# Patient Record
Sex: Female | Born: 1935
Health system: Southern US, Community
[De-identification: ages and names within clinical notes are randomized; demographics above are authoritative.]

## PROBLEM LIST (undated history)

## (undated) DIAGNOSIS — M109 Gout, unspecified: Secondary | ICD-10-CM

## (undated) DIAGNOSIS — I639 Cerebral infarction, unspecified: Secondary | ICD-10-CM

## (undated) DIAGNOSIS — I4891 Unspecified atrial fibrillation: Secondary | ICD-10-CM

## (undated) DIAGNOSIS — E669 Obesity, unspecified: Secondary | ICD-10-CM

## (undated) DIAGNOSIS — G5601 Carpal tunnel syndrome, right upper limb: Secondary | ICD-10-CM

## (undated) DIAGNOSIS — E785 Hyperlipidemia, unspecified: Secondary | ICD-10-CM

## (undated) DIAGNOSIS — I6529 Occlusion and stenosis of unspecified carotid artery: Secondary | ICD-10-CM

## (undated) DIAGNOSIS — G250 Essential tremor: Secondary | ICD-10-CM

## (undated) DIAGNOSIS — M1990 Unspecified osteoarthritis, unspecified site: Secondary | ICD-10-CM

## (undated) DIAGNOSIS — E119 Type 2 diabetes mellitus without complications: Secondary | ICD-10-CM

## (undated) DIAGNOSIS — I10 Essential (primary) hypertension: Secondary | ICD-10-CM

## (undated) DIAGNOSIS — N189 Chronic kidney disease, unspecified: Secondary | ICD-10-CM

## (undated) DIAGNOSIS — H409 Unspecified glaucoma: Secondary | ICD-10-CM

## (undated) HISTORY — DX: Unspecified glaucoma: H40.9

## (undated) HISTORY — DX: Unspecified atrial fibrillation: I48.91

## (undated) HISTORY — DX: Cerebral infarction, unspecified: I63.9

## (undated) HISTORY — DX: Gout, unspecified: M10.9

## (undated) HISTORY — DX: Essential tremor: G25.0

## (undated) HISTORY — DX: Obesity, unspecified: E66.9

## (undated) HISTORY — DX: Type 2 diabetes mellitus without complications: E11.9

## (undated) HISTORY — DX: Hyperlipidemia, unspecified: E78.5

## (undated) HISTORY — DX: Chronic kidney disease, unspecified: N18.9

## (undated) HISTORY — DX: Essential (primary) hypertension: I10

## (undated) HISTORY — DX: Unspecified osteoarthritis, unspecified site: M19.90

## (undated) HISTORY — DX: Carpal tunnel syndrome, right upper limb: G56.01

## (undated) HISTORY — DX: Occlusion and stenosis of unspecified carotid artery: I65.29

---

## 1980-03-08 HISTORY — PX: ABDOMINAL HYSTERECTOMY: SHX81

## 1984-03-08 HISTORY — PX: CHOLECYSTECTOMY: SHX55

## 2002-03-08 HISTORY — PX: CATARACT EXTRACTION EXTRACAPSULAR: SHX1305

## 2004-03-08 HISTORY — PX: CATARACT EXTRACTION EXTRACAPSULAR: SHX1305

## 2004-03-25 ENCOUNTER — Ambulatory Visit: Payer: Self-pay | Admitting: Ophthalmology

## 2004-03-31 ENCOUNTER — Ambulatory Visit: Payer: Self-pay | Admitting: Ophthalmology

## 2004-07-01 ENCOUNTER — Ambulatory Visit: Payer: Self-pay | Admitting: Family Medicine

## 2005-06-23 ENCOUNTER — Ambulatory Visit: Payer: Self-pay | Admitting: Ophthalmology

## 2005-06-29 ENCOUNTER — Ambulatory Visit: Payer: Self-pay | Admitting: Ophthalmology

## 2005-11-02 ENCOUNTER — Ambulatory Visit: Payer: Self-pay | Admitting: Family Medicine

## 2006-06-29 ENCOUNTER — Other Ambulatory Visit: Payer: Self-pay

## 2006-06-29 ENCOUNTER — Inpatient Hospital Stay: Payer: Self-pay | Admitting: Internal Medicine

## 2006-11-24 ENCOUNTER — Ambulatory Visit: Payer: Self-pay | Admitting: Family Medicine

## 2008-02-05 ENCOUNTER — Ambulatory Visit: Payer: Self-pay | Admitting: Family Medicine

## 2009-02-10 ENCOUNTER — Ambulatory Visit: Payer: Self-pay | Admitting: Family Medicine

## 2009-04-28 ENCOUNTER — Ambulatory Visit: Payer: Self-pay | Admitting: Family Medicine

## 2010-03-27 ENCOUNTER — Ambulatory Visit: Payer: Self-pay | Admitting: Family Medicine

## 2011-04-19 ENCOUNTER — Ambulatory Visit: Payer: Self-pay | Admitting: Family Medicine

## 2012-05-01 ENCOUNTER — Emergency Department: Payer: Self-pay | Admitting: Emergency Medicine

## 2012-05-01 LAB — CBC
MCHC: 33 g/dL (ref 32.0–36.0)
MCV: 97 fL (ref 80–100)
Platelet: 230 10*3/uL (ref 150–440)
WBC: 7.6 10*3/uL (ref 3.6–11.0)

## 2012-05-01 LAB — PROTIME-INR: Prothrombin Time: 22.9 secs — ABNORMAL HIGH (ref 11.5–14.7)

## 2013-03-20 DIAGNOSIS — I4891 Unspecified atrial fibrillation: Secondary | ICD-10-CM | POA: Diagnosis not present

## 2013-04-17 DIAGNOSIS — I4891 Unspecified atrial fibrillation: Secondary | ICD-10-CM | POA: Diagnosis not present

## 2013-05-15 DIAGNOSIS — I4891 Unspecified atrial fibrillation: Secondary | ICD-10-CM | POA: Diagnosis not present

## 2013-06-12 DIAGNOSIS — I4891 Unspecified atrial fibrillation: Secondary | ICD-10-CM | POA: Diagnosis not present

## 2013-06-19 DIAGNOSIS — E119 Type 2 diabetes mellitus without complications: Secondary | ICD-10-CM | POA: Diagnosis not present

## 2013-06-19 DIAGNOSIS — E7800 Pure hypercholesterolemia, unspecified: Secondary | ICD-10-CM | POA: Diagnosis not present

## 2013-06-26 DIAGNOSIS — I10 Essential (primary) hypertension: Secondary | ICD-10-CM | POA: Diagnosis not present

## 2013-06-26 DIAGNOSIS — Z0000 Encounter for general adult medical examination without abnormal findings: Secondary | ICD-10-CM | POA: Diagnosis not present

## 2013-06-26 DIAGNOSIS — I4891 Unspecified atrial fibrillation: Secondary | ICD-10-CM | POA: Diagnosis not present

## 2013-06-26 DIAGNOSIS — E119 Type 2 diabetes mellitus without complications: Secondary | ICD-10-CM | POA: Diagnosis not present

## 2013-06-27 DIAGNOSIS — I4891 Unspecified atrial fibrillation: Secondary | ICD-10-CM | POA: Diagnosis not present

## 2013-06-27 DIAGNOSIS — R609 Edema, unspecified: Secondary | ICD-10-CM | POA: Diagnosis not present

## 2013-06-27 DIAGNOSIS — I10 Essential (primary) hypertension: Secondary | ICD-10-CM | POA: Diagnosis not present

## 2013-07-10 DIAGNOSIS — I4891 Unspecified atrial fibrillation: Secondary | ICD-10-CM | POA: Diagnosis not present

## 2013-08-07 DIAGNOSIS — I4891 Unspecified atrial fibrillation: Secondary | ICD-10-CM | POA: Diagnosis not present

## 2013-09-06 DIAGNOSIS — I4891 Unspecified atrial fibrillation: Secondary | ICD-10-CM | POA: Diagnosis not present

## 2013-10-04 DIAGNOSIS — I4891 Unspecified atrial fibrillation: Secondary | ICD-10-CM | POA: Diagnosis not present

## 2013-10-10 DIAGNOSIS — M109 Gout, unspecified: Secondary | ICD-10-CM | POA: Diagnosis not present

## 2013-10-10 DIAGNOSIS — L0390 Cellulitis, unspecified: Secondary | ICD-10-CM | POA: Diagnosis not present

## 2013-10-10 DIAGNOSIS — L0291 Cutaneous abscess, unspecified: Secondary | ICD-10-CM | POA: Diagnosis not present

## 2013-10-16 DIAGNOSIS — I4891 Unspecified atrial fibrillation: Secondary | ICD-10-CM | POA: Diagnosis not present

## 2013-10-16 DIAGNOSIS — E119 Type 2 diabetes mellitus without complications: Secondary | ICD-10-CM | POA: Diagnosis not present

## 2013-10-23 DIAGNOSIS — I4891 Unspecified atrial fibrillation: Secondary | ICD-10-CM | POA: Diagnosis not present

## 2013-10-23 DIAGNOSIS — E785 Hyperlipidemia, unspecified: Secondary | ICD-10-CM | POA: Diagnosis not present

## 2013-10-23 DIAGNOSIS — M109 Gout, unspecified: Secondary | ICD-10-CM | POA: Diagnosis not present

## 2013-10-23 DIAGNOSIS — I10 Essential (primary) hypertension: Secondary | ICD-10-CM | POA: Diagnosis not present

## 2013-11-13 DIAGNOSIS — I4891 Unspecified atrial fibrillation: Secondary | ICD-10-CM | POA: Diagnosis not present

## 2013-11-19 DIAGNOSIS — I4891 Unspecified atrial fibrillation: Secondary | ICD-10-CM | POA: Diagnosis not present

## 2013-11-19 DIAGNOSIS — Z79899 Other long term (current) drug therapy: Secondary | ICD-10-CM | POA: Diagnosis not present

## 2013-11-27 DIAGNOSIS — J029 Acute pharyngitis, unspecified: Secondary | ICD-10-CM | POA: Diagnosis not present

## 2013-11-27 DIAGNOSIS — I10 Essential (primary) hypertension: Secondary | ICD-10-CM | POA: Diagnosis not present

## 2013-11-27 DIAGNOSIS — I4891 Unspecified atrial fibrillation: Secondary | ICD-10-CM | POA: Diagnosis not present

## 2013-12-11 DIAGNOSIS — I10 Essential (primary) hypertension: Secondary | ICD-10-CM | POA: Diagnosis not present

## 2013-12-11 DIAGNOSIS — I480 Paroxysmal atrial fibrillation: Secondary | ICD-10-CM | POA: Diagnosis not present

## 2013-12-11 DIAGNOSIS — R0602 Shortness of breath: Secondary | ICD-10-CM | POA: Diagnosis not present

## 2013-12-11 DIAGNOSIS — E780 Pure hypercholesterolemia: Secondary | ICD-10-CM | POA: Diagnosis not present

## 2013-12-20 DIAGNOSIS — I480 Paroxysmal atrial fibrillation: Secondary | ICD-10-CM | POA: Diagnosis not present

## 2013-12-20 DIAGNOSIS — R0602 Shortness of breath: Secondary | ICD-10-CM | POA: Diagnosis not present

## 2013-12-27 DIAGNOSIS — I129 Hypertensive chronic kidney disease with stage 1 through stage 4 chronic kidney disease, or unspecified chronic kidney disease: Secondary | ICD-10-CM | POA: Diagnosis not present

## 2013-12-27 DIAGNOSIS — E780 Pure hypercholesterolemia: Secondary | ICD-10-CM | POA: Diagnosis not present

## 2013-12-27 DIAGNOSIS — I480 Paroxysmal atrial fibrillation: Secondary | ICD-10-CM | POA: Diagnosis not present

## 2013-12-27 DIAGNOSIS — I10 Essential (primary) hypertension: Secondary | ICD-10-CM | POA: Diagnosis not present

## 2014-01-09 DIAGNOSIS — I4891 Unspecified atrial fibrillation: Secondary | ICD-10-CM | POA: Diagnosis not present

## 2014-02-06 DIAGNOSIS — I4891 Unspecified atrial fibrillation: Secondary | ICD-10-CM | POA: Diagnosis not present

## 2014-03-06 DIAGNOSIS — I4891 Unspecified atrial fibrillation: Secondary | ICD-10-CM | POA: Diagnosis not present

## 2014-04-03 DIAGNOSIS — I4891 Unspecified atrial fibrillation: Secondary | ICD-10-CM | POA: Diagnosis not present

## 2014-04-10 DIAGNOSIS — I10 Essential (primary) hypertension: Secondary | ICD-10-CM | POA: Diagnosis not present

## 2014-04-10 DIAGNOSIS — I482 Chronic atrial fibrillation: Secondary | ICD-10-CM | POA: Diagnosis not present

## 2014-05-01 DIAGNOSIS — I4891 Unspecified atrial fibrillation: Secondary | ICD-10-CM | POA: Diagnosis not present

## 2014-05-21 ENCOUNTER — Emergency Department: Payer: Self-pay | Admitting: Emergency Medicine

## 2014-05-21 DIAGNOSIS — N179 Acute kidney failure, unspecified: Secondary | ICD-10-CM | POA: Diagnosis not present

## 2014-05-21 DIAGNOSIS — R0602 Shortness of breath: Secondary | ICD-10-CM | POA: Diagnosis not present

## 2014-05-21 DIAGNOSIS — Z7901 Long term (current) use of anticoagulants: Secondary | ICD-10-CM | POA: Diagnosis not present

## 2014-05-21 DIAGNOSIS — I10 Essential (primary) hypertension: Secondary | ICD-10-CM | POA: Diagnosis not present

## 2014-05-21 DIAGNOSIS — Z79899 Other long term (current) drug therapy: Secondary | ICD-10-CM | POA: Diagnosis not present

## 2014-05-21 DIAGNOSIS — R791 Abnormal coagulation profile: Secondary | ICD-10-CM | POA: Diagnosis not present

## 2014-05-24 DIAGNOSIS — I482 Chronic atrial fibrillation: Secondary | ICD-10-CM | POA: Diagnosis not present

## 2014-05-24 DIAGNOSIS — R7309 Other abnormal glucose: Secondary | ICD-10-CM | POA: Diagnosis not present

## 2014-05-24 DIAGNOSIS — N183 Chronic kidney disease, stage 3 (moderate): Secondary | ICD-10-CM | POA: Diagnosis not present

## 2014-05-24 DIAGNOSIS — I10 Essential (primary) hypertension: Secondary | ICD-10-CM | POA: Diagnosis not present

## 2014-05-29 DIAGNOSIS — I4891 Unspecified atrial fibrillation: Secondary | ICD-10-CM | POA: Diagnosis not present

## 2014-06-24 DIAGNOSIS — E119 Type 2 diabetes mellitus without complications: Secondary | ICD-10-CM | POA: Diagnosis not present

## 2014-06-24 DIAGNOSIS — I10 Essential (primary) hypertension: Secondary | ICD-10-CM | POA: Diagnosis not present

## 2014-06-24 DIAGNOSIS — N183 Chronic kidney disease, stage 3 (moderate): Secondary | ICD-10-CM | POA: Diagnosis not present

## 2014-06-26 DIAGNOSIS — I4891 Unspecified atrial fibrillation: Secondary | ICD-10-CM | POA: Diagnosis not present

## 2014-07-04 DIAGNOSIS — Z0000 Encounter for general adult medical examination without abnormal findings: Secondary | ICD-10-CM | POA: Diagnosis not present

## 2014-07-04 DIAGNOSIS — R739 Hyperglycemia, unspecified: Secondary | ICD-10-CM | POA: Diagnosis not present

## 2014-07-04 DIAGNOSIS — N183 Chronic kidney disease, stage 3 (moderate): Secondary | ICD-10-CM | POA: Diagnosis not present

## 2014-07-04 DIAGNOSIS — I482 Chronic atrial fibrillation: Secondary | ICD-10-CM | POA: Diagnosis not present

## 2014-07-04 DIAGNOSIS — I10 Essential (primary) hypertension: Secondary | ICD-10-CM | POA: Diagnosis not present

## 2014-07-05 DIAGNOSIS — R7309 Other abnormal glucose: Secondary | ICD-10-CM | POA: Diagnosis not present

## 2014-07-05 DIAGNOSIS — I129 Hypertensive chronic kidney disease with stage 1 through stage 4 chronic kidney disease, or unspecified chronic kidney disease: Secondary | ICD-10-CM | POA: Diagnosis not present

## 2014-07-05 DIAGNOSIS — I10 Essential (primary) hypertension: Secondary | ICD-10-CM | POA: Diagnosis not present

## 2014-07-05 DIAGNOSIS — I482 Chronic atrial fibrillation: Secondary | ICD-10-CM | POA: Diagnosis not present

## 2014-07-10 DIAGNOSIS — N183 Chronic kidney disease, stage 3 (moderate): Secondary | ICD-10-CM | POA: Diagnosis not present

## 2014-07-16 DIAGNOSIS — N183 Chronic kidney disease, stage 3 (moderate): Secondary | ICD-10-CM | POA: Diagnosis not present

## 2014-07-16 DIAGNOSIS — I10 Essential (primary) hypertension: Secondary | ICD-10-CM | POA: Diagnosis not present

## 2014-07-16 DIAGNOSIS — E119 Type 2 diabetes mellitus without complications: Secondary | ICD-10-CM | POA: Diagnosis not present

## 2014-07-24 DIAGNOSIS — R739 Hyperglycemia, unspecified: Secondary | ICD-10-CM | POA: Diagnosis not present

## 2014-07-24 DIAGNOSIS — E780 Pure hypercholesterolemia: Secondary | ICD-10-CM | POA: Diagnosis not present

## 2014-07-24 DIAGNOSIS — I482 Chronic atrial fibrillation: Secondary | ICD-10-CM | POA: Diagnosis not present

## 2014-07-24 DIAGNOSIS — M109 Gout, unspecified: Secondary | ICD-10-CM | POA: Diagnosis not present

## 2014-08-21 DIAGNOSIS — I482 Chronic atrial fibrillation: Secondary | ICD-10-CM | POA: Diagnosis not present

## 2014-09-18 DIAGNOSIS — I482 Chronic atrial fibrillation: Secondary | ICD-10-CM | POA: Diagnosis not present

## 2014-10-16 DIAGNOSIS — I482 Chronic atrial fibrillation: Secondary | ICD-10-CM | POA: Diagnosis not present

## 2014-11-05 DIAGNOSIS — I482 Chronic atrial fibrillation: Secondary | ICD-10-CM | POA: Diagnosis not present

## 2014-11-05 DIAGNOSIS — I10 Essential (primary) hypertension: Secondary | ICD-10-CM | POA: Diagnosis not present

## 2014-11-05 DIAGNOSIS — E780 Pure hypercholesterolemia: Secondary | ICD-10-CM | POA: Diagnosis not present

## 2014-11-13 DIAGNOSIS — I482 Chronic atrial fibrillation: Secondary | ICD-10-CM | POA: Diagnosis not present

## 2014-11-14 DIAGNOSIS — E119 Type 2 diabetes mellitus without complications: Secondary | ICD-10-CM | POA: Diagnosis not present

## 2014-11-14 DIAGNOSIS — I10 Essential (primary) hypertension: Secondary | ICD-10-CM | POA: Diagnosis not present

## 2014-11-14 DIAGNOSIS — N183 Chronic kidney disease, stage 3 (moderate): Secondary | ICD-10-CM | POA: Diagnosis not present

## 2014-12-11 DIAGNOSIS — I482 Chronic atrial fibrillation: Secondary | ICD-10-CM | POA: Diagnosis not present

## 2015-01-02 DIAGNOSIS — E669 Obesity, unspecified: Secondary | ICD-10-CM | POA: Insufficient documentation

## 2015-01-02 DIAGNOSIS — R7303 Prediabetes: Secondary | ICD-10-CM | POA: Diagnosis not present

## 2015-01-02 DIAGNOSIS — I10 Essential (primary) hypertension: Secondary | ICD-10-CM | POA: Diagnosis not present

## 2015-01-02 DIAGNOSIS — I480 Paroxysmal atrial fibrillation: Secondary | ICD-10-CM | POA: Diagnosis not present

## 2015-01-02 DIAGNOSIS — E66812 Obesity, class 2: Secondary | ICD-10-CM | POA: Insufficient documentation

## 2015-01-02 DIAGNOSIS — E7800 Pure hypercholesterolemia, unspecified: Secondary | ICD-10-CM | POA: Diagnosis not present

## 2015-01-02 DIAGNOSIS — N183 Chronic kidney disease, stage 3 (moderate): Secondary | ICD-10-CM | POA: Diagnosis not present

## 2015-01-02 DIAGNOSIS — R739 Hyperglycemia, unspecified: Secondary | ICD-10-CM | POA: Diagnosis not present

## 2015-01-08 DIAGNOSIS — I10 Essential (primary) hypertension: Secondary | ICD-10-CM | POA: Diagnosis not present

## 2015-01-08 DIAGNOSIS — M109 Gout, unspecified: Secondary | ICD-10-CM | POA: Diagnosis not present

## 2015-01-08 DIAGNOSIS — I482 Chronic atrial fibrillation: Secondary | ICD-10-CM | POA: Diagnosis not present

## 2015-01-08 DIAGNOSIS — I480 Paroxysmal atrial fibrillation: Secondary | ICD-10-CM | POA: Diagnosis not present

## 2015-01-08 DIAGNOSIS — R7303 Prediabetes: Secondary | ICD-10-CM | POA: Diagnosis not present

## 2015-01-08 DIAGNOSIS — R739 Hyperglycemia, unspecified: Secondary | ICD-10-CM | POA: Diagnosis not present

## 2015-01-08 DIAGNOSIS — E7800 Pure hypercholesterolemia, unspecified: Secondary | ICD-10-CM | POA: Diagnosis not present

## 2015-02-05 DIAGNOSIS — I482 Chronic atrial fibrillation: Secondary | ICD-10-CM | POA: Diagnosis not present

## 2015-03-06 DIAGNOSIS — I482 Chronic atrial fibrillation: Secondary | ICD-10-CM | POA: Diagnosis not present

## 2015-03-13 DIAGNOSIS — E119 Type 2 diabetes mellitus without complications: Secondary | ICD-10-CM | POA: Diagnosis not present

## 2015-03-13 DIAGNOSIS — M109 Gout, unspecified: Secondary | ICD-10-CM | POA: Diagnosis not present

## 2015-03-13 DIAGNOSIS — N184 Chronic kidney disease, stage 4 (severe): Secondary | ICD-10-CM | POA: Diagnosis not present

## 2015-03-13 DIAGNOSIS — I10 Essential (primary) hypertension: Secondary | ICD-10-CM | POA: Diagnosis not present

## 2015-04-03 DIAGNOSIS — I482 Chronic atrial fibrillation: Secondary | ICD-10-CM | POA: Diagnosis not present

## 2015-05-01 DIAGNOSIS — I482 Chronic atrial fibrillation: Secondary | ICD-10-CM | POA: Diagnosis not present

## 2015-05-06 DIAGNOSIS — I480 Paroxysmal atrial fibrillation: Secondary | ICD-10-CM | POA: Diagnosis not present

## 2015-05-06 DIAGNOSIS — N183 Chronic kidney disease, stage 3 (moderate): Secondary | ICD-10-CM | POA: Diagnosis not present

## 2015-05-06 DIAGNOSIS — R7303 Prediabetes: Secondary | ICD-10-CM | POA: Diagnosis not present

## 2015-05-06 DIAGNOSIS — R739 Hyperglycemia, unspecified: Secondary | ICD-10-CM | POA: Diagnosis not present

## 2015-05-06 DIAGNOSIS — I10 Essential (primary) hypertension: Secondary | ICD-10-CM | POA: Diagnosis not present

## 2015-05-06 DIAGNOSIS — E7800 Pure hypercholesterolemia, unspecified: Secondary | ICD-10-CM | POA: Diagnosis not present

## 2015-05-12 IMAGING — CR DG CHEST 2V
1 series · 2 of 2 positions shown · non-contrast
Comparison: Chest radiograph and CTA of the chest performed
06/29/2006

CLINICAL DATA: Acute onset of severe shortness of breath for 2
days. Initial encounter.

EXAM:
CHEST  2 VIEW

[Series 1: w chest pa · 0.14mm/px · 2 of 2 slices shown]
[im 1/2]
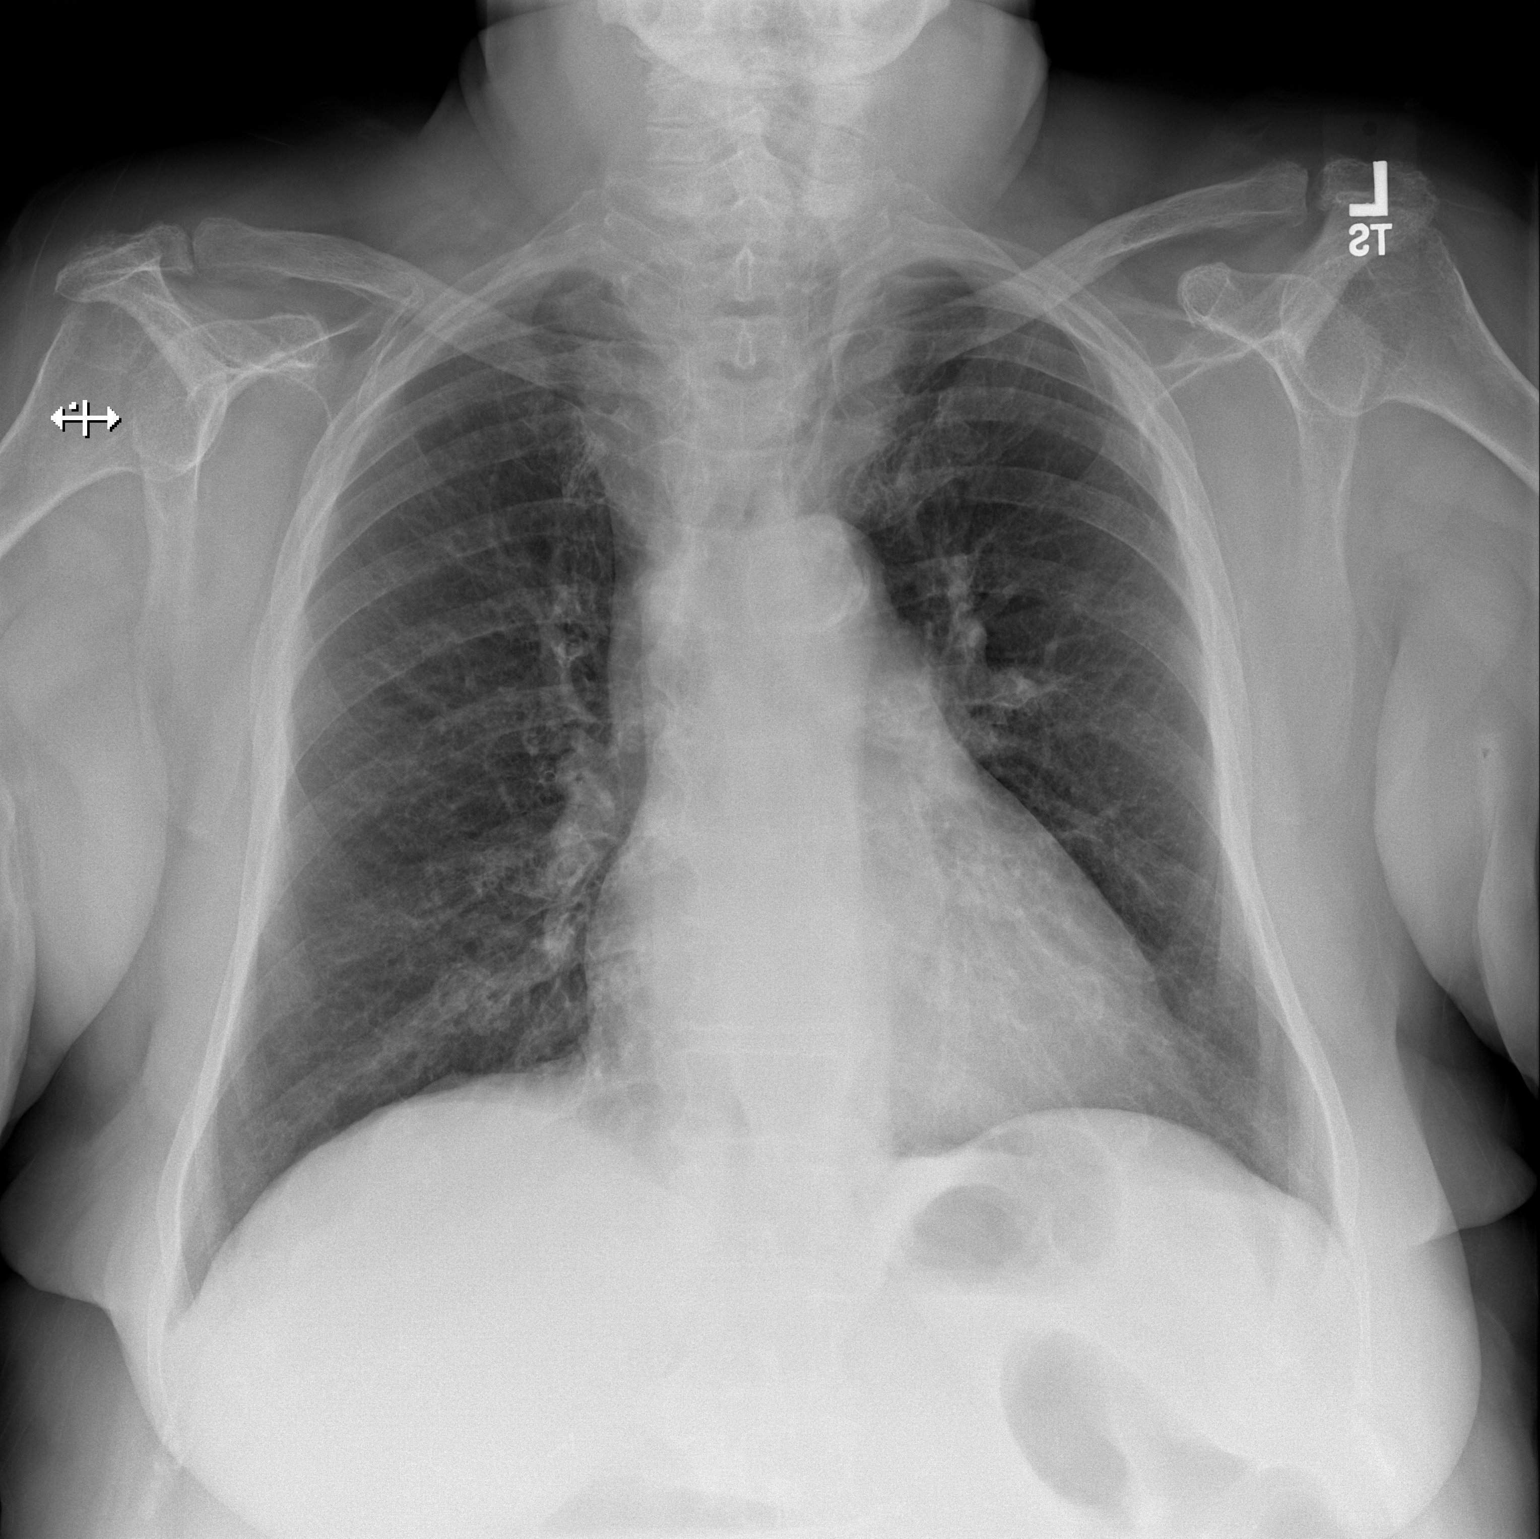
[im 2/2]
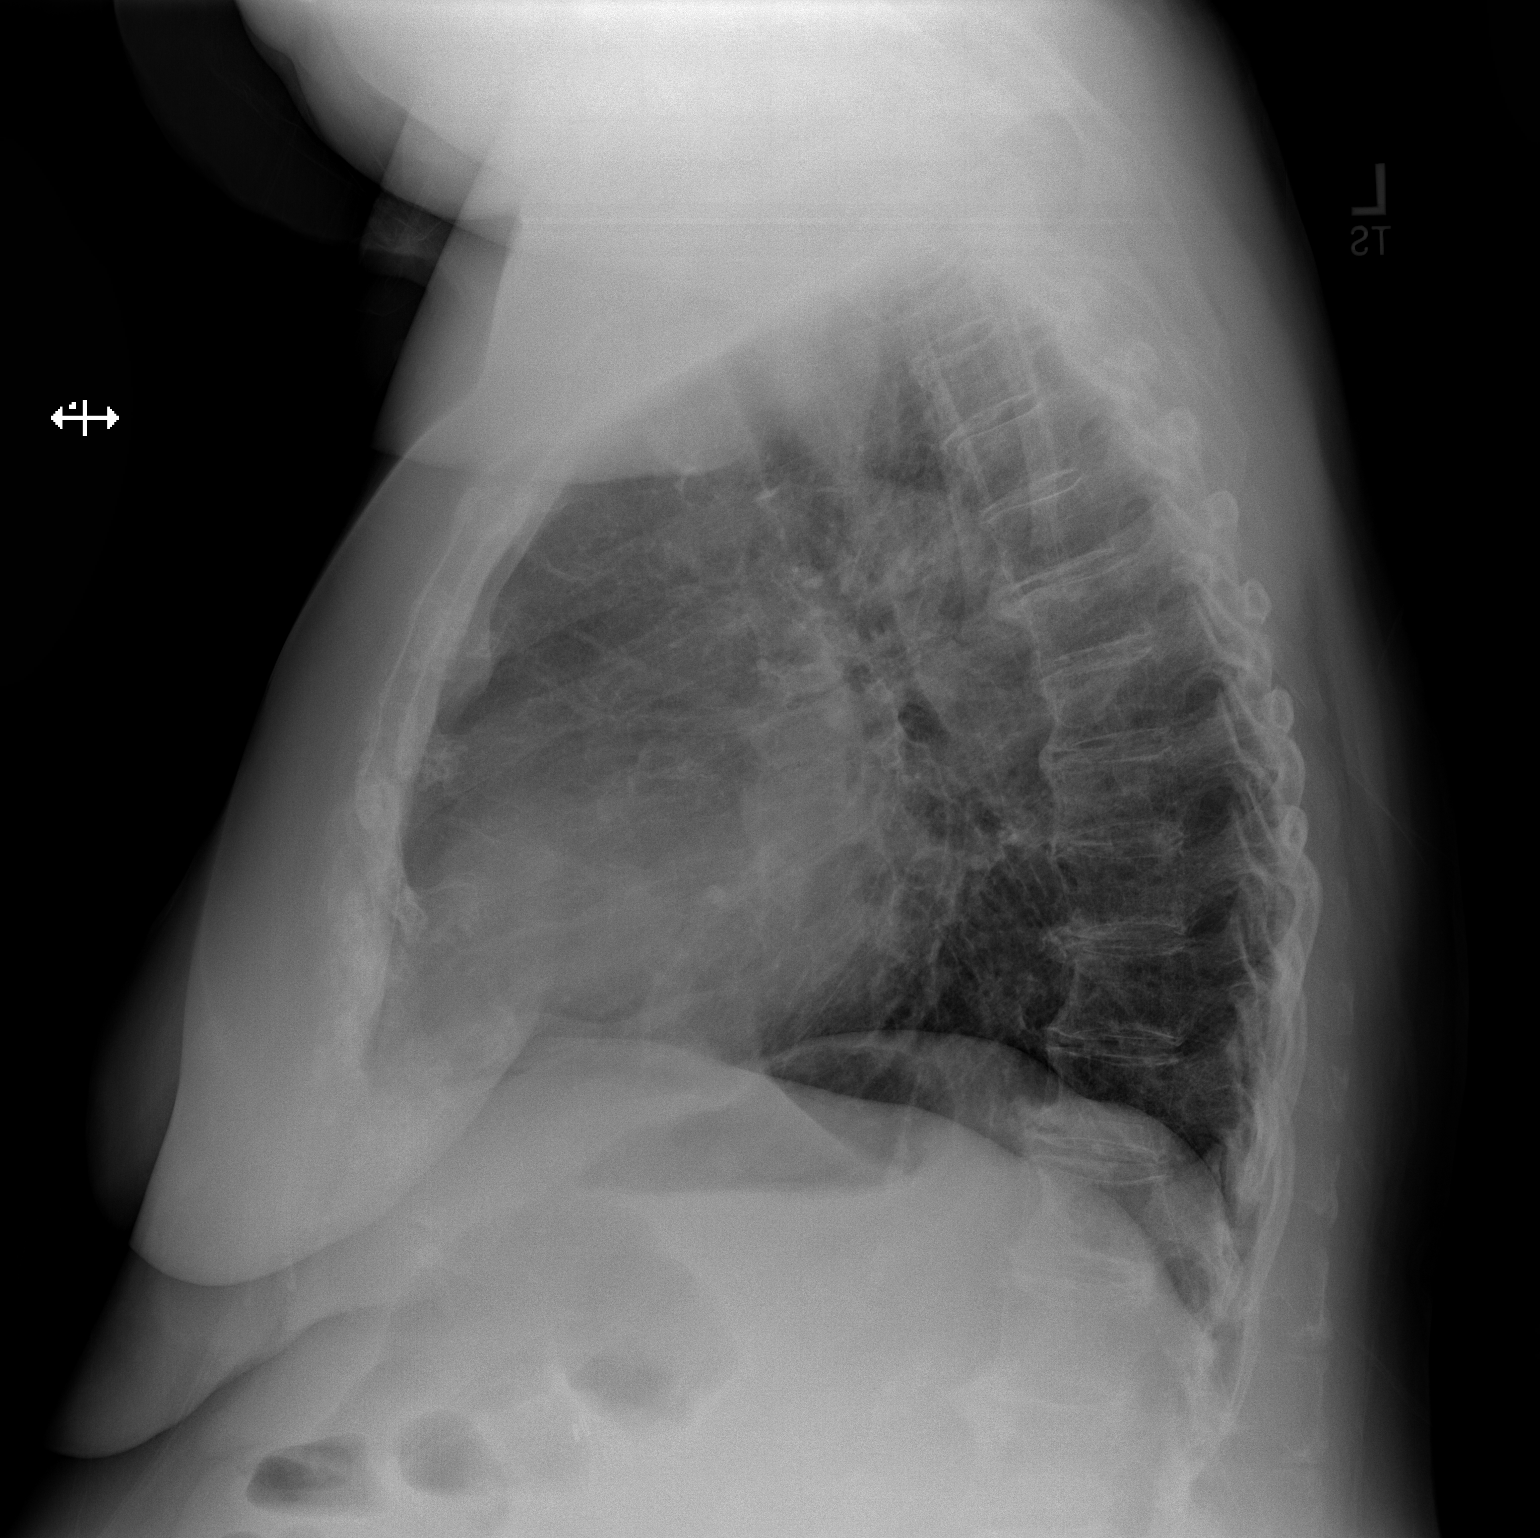

[2 of 2 positions shown; findings below may reference images not displayed]

FINDINGS: The lungs are well-aerated. Mild peribronchial thickening is noted.
There is no evidence of focal opacification, pleural effusion or
pneumothorax.

The heart is normal in size; the mediastinal contour is within
normal limits. No acute osseous abnormalities are seen. Clips are
noted within the right upper quadrant, reflecting prior
cholecystectomy.
IMPRESSION: Mild peribronchial thickening noted; lungs otherwise clear.

## 2015-05-29 DIAGNOSIS — I480 Paroxysmal atrial fibrillation: Secondary | ICD-10-CM | POA: Diagnosis not present

## 2015-06-04 DIAGNOSIS — N2581 Secondary hyperparathyroidism of renal origin: Secondary | ICD-10-CM | POA: Diagnosis not present

## 2015-06-04 DIAGNOSIS — I10 Essential (primary) hypertension: Secondary | ICD-10-CM | POA: Diagnosis not present

## 2015-06-04 DIAGNOSIS — M109 Gout, unspecified: Secondary | ICD-10-CM | POA: Diagnosis not present

## 2015-06-04 DIAGNOSIS — N184 Chronic kidney disease, stage 4 (severe): Secondary | ICD-10-CM | POA: Diagnosis not present

## 2015-06-04 DIAGNOSIS — E119 Type 2 diabetes mellitus without complications: Secondary | ICD-10-CM | POA: Diagnosis not present

## 2015-06-26 DIAGNOSIS — N183 Chronic kidney disease, stage 3 (moderate): Secondary | ICD-10-CM | POA: Diagnosis not present

## 2015-06-26 DIAGNOSIS — I482 Chronic atrial fibrillation: Secondary | ICD-10-CM | POA: Diagnosis not present

## 2015-06-26 DIAGNOSIS — I129 Hypertensive chronic kidney disease with stage 1 through stage 4 chronic kidney disease, or unspecified chronic kidney disease: Secondary | ICD-10-CM | POA: Diagnosis not present

## 2015-06-26 DIAGNOSIS — I10 Essential (primary) hypertension: Secondary | ICD-10-CM | POA: Diagnosis not present

## 2015-07-24 DIAGNOSIS — I482 Chronic atrial fibrillation: Secondary | ICD-10-CM | POA: Diagnosis not present

## 2015-08-21 DIAGNOSIS — I482 Chronic atrial fibrillation: Secondary | ICD-10-CM | POA: Diagnosis not present

## 2015-08-25 DIAGNOSIS — N2581 Secondary hyperparathyroidism of renal origin: Secondary | ICD-10-CM | POA: Diagnosis not present

## 2015-08-25 DIAGNOSIS — N184 Chronic kidney disease, stage 4 (severe): Secondary | ICD-10-CM | POA: Diagnosis not present

## 2015-08-25 DIAGNOSIS — I10 Essential (primary) hypertension: Secondary | ICD-10-CM | POA: Diagnosis not present

## 2015-08-25 DIAGNOSIS — M109 Gout, unspecified: Secondary | ICD-10-CM | POA: Diagnosis not present

## 2015-09-18 DIAGNOSIS — I482 Chronic atrial fibrillation: Secondary | ICD-10-CM | POA: Diagnosis not present

## 2015-10-16 DIAGNOSIS — R5382 Chronic fatigue, unspecified: Secondary | ICD-10-CM | POA: Diagnosis not present

## 2015-10-16 DIAGNOSIS — E669 Obesity, unspecified: Secondary | ICD-10-CM | POA: Diagnosis not present

## 2015-10-16 DIAGNOSIS — I129 Hypertensive chronic kidney disease with stage 1 through stage 4 chronic kidney disease, or unspecified chronic kidney disease: Secondary | ICD-10-CM | POA: Diagnosis not present

## 2015-10-16 DIAGNOSIS — R739 Hyperglycemia, unspecified: Secondary | ICD-10-CM | POA: Diagnosis not present

## 2015-10-16 DIAGNOSIS — I480 Paroxysmal atrial fibrillation: Secondary | ICD-10-CM | POA: Diagnosis not present

## 2015-10-16 DIAGNOSIS — I482 Chronic atrial fibrillation: Secondary | ICD-10-CM | POA: Diagnosis not present

## 2015-10-16 DIAGNOSIS — E7800 Pure hypercholesterolemia, unspecified: Secondary | ICD-10-CM | POA: Diagnosis not present

## 2015-10-16 DIAGNOSIS — N183 Chronic kidney disease, stage 3 (moderate): Secondary | ICD-10-CM | POA: Diagnosis not present

## 2015-11-04 DIAGNOSIS — E7800 Pure hypercholesterolemia, unspecified: Secondary | ICD-10-CM | POA: Diagnosis not present

## 2015-11-04 DIAGNOSIS — R7303 Prediabetes: Secondary | ICD-10-CM | POA: Diagnosis not present

## 2015-11-04 DIAGNOSIS — I480 Paroxysmal atrial fibrillation: Secondary | ICD-10-CM | POA: Diagnosis not present

## 2015-11-13 DIAGNOSIS — R5382 Chronic fatigue, unspecified: Secondary | ICD-10-CM | POA: Diagnosis not present

## 2015-11-13 DIAGNOSIS — E7800 Pure hypercholesterolemia, unspecified: Secondary | ICD-10-CM | POA: Diagnosis not present

## 2015-11-13 DIAGNOSIS — N183 Chronic kidney disease, stage 3 (moderate): Secondary | ICD-10-CM | POA: Diagnosis not present

## 2015-11-13 DIAGNOSIS — I129 Hypertensive chronic kidney disease with stage 1 through stage 4 chronic kidney disease, or unspecified chronic kidney disease: Secondary | ICD-10-CM | POA: Diagnosis not present

## 2015-11-13 DIAGNOSIS — R739 Hyperglycemia, unspecified: Secondary | ICD-10-CM | POA: Diagnosis not present

## 2015-11-13 DIAGNOSIS — I482 Chronic atrial fibrillation: Secondary | ICD-10-CM | POA: Diagnosis not present

## 2015-11-17 DIAGNOSIS — R809 Proteinuria, unspecified: Secondary | ICD-10-CM | POA: Diagnosis not present

## 2015-11-17 DIAGNOSIS — I10 Essential (primary) hypertension: Secondary | ICD-10-CM | POA: Diagnosis not present

## 2015-11-17 DIAGNOSIS — N2581 Secondary hyperparathyroidism of renal origin: Secondary | ICD-10-CM | POA: Diagnosis not present

## 2015-11-17 DIAGNOSIS — N184 Chronic kidney disease, stage 4 (severe): Secondary | ICD-10-CM | POA: Diagnosis not present

## 2015-11-17 DIAGNOSIS — M109 Gout, unspecified: Secondary | ICD-10-CM | POA: Diagnosis not present

## 2015-12-11 DIAGNOSIS — I482 Chronic atrial fibrillation: Secondary | ICD-10-CM | POA: Diagnosis not present

## 2016-01-08 DIAGNOSIS — I482 Chronic atrial fibrillation: Secondary | ICD-10-CM | POA: Diagnosis not present

## 2016-02-04 ENCOUNTER — Other Ambulatory Visit
Admission: RE | Admit: 2016-02-04 | Discharge: 2016-02-04 | Disposition: A | Discharge status: Home / Self Care | Payer: Medicare Other | Source: Ambulatory Visit | Attending: Internal Medicine | Admitting: Internal Medicine

## 2016-02-04 DIAGNOSIS — R0602 Shortness of breath: Secondary | ICD-10-CM | POA: Insufficient documentation

## 2016-02-04 DIAGNOSIS — R05 Cough: Secondary | ICD-10-CM | POA: Diagnosis not present

## 2016-02-04 DIAGNOSIS — J069 Acute upper respiratory infection, unspecified: Secondary | ICD-10-CM | POA: Insufficient documentation

## 2016-02-04 DIAGNOSIS — I482 Chronic atrial fibrillation: Secondary | ICD-10-CM | POA: Diagnosis not present

## 2016-02-04 DIAGNOSIS — I700 Atherosclerosis of aorta: Secondary | ICD-10-CM | POA: Insufficient documentation

## 2016-02-04 LAB — TROPONIN I

## 2016-02-04 LAB — FIBRIN DERIVATIVES D-DIMER (ARMC ONLY): FIBRIN DERIVATIVES D-DIMER (ARMC): 407 (ref 0–499)

## 2016-02-13 DIAGNOSIS — N2581 Secondary hyperparathyroidism of renal origin: Secondary | ICD-10-CM | POA: Diagnosis not present

## 2016-02-13 DIAGNOSIS — M109 Gout, unspecified: Secondary | ICD-10-CM | POA: Diagnosis not present

## 2016-02-13 DIAGNOSIS — I10 Essential (primary) hypertension: Secondary | ICD-10-CM | POA: Diagnosis not present

## 2016-02-13 DIAGNOSIS — N184 Chronic kidney disease, stage 4 (severe): Secondary | ICD-10-CM | POA: Diagnosis not present

## 2016-03-03 DIAGNOSIS — I482 Chronic atrial fibrillation: Secondary | ICD-10-CM | POA: Diagnosis not present

## 2016-03-31 DIAGNOSIS — I482 Chronic atrial fibrillation: Secondary | ICD-10-CM | POA: Diagnosis not present

## 2016-04-20 DIAGNOSIS — E7800 Pure hypercholesterolemia, unspecified: Secondary | ICD-10-CM | POA: Diagnosis not present

## 2016-04-20 DIAGNOSIS — I480 Paroxysmal atrial fibrillation: Secondary | ICD-10-CM | POA: Diagnosis not present

## 2016-04-20 DIAGNOSIS — R739 Hyperglycemia, unspecified: Secondary | ICD-10-CM | POA: Diagnosis not present

## 2016-04-20 DIAGNOSIS — Z0000 Encounter for general adult medical examination without abnormal findings: Secondary | ICD-10-CM | POA: Diagnosis not present

## 2016-04-20 DIAGNOSIS — N183 Chronic kidney disease, stage 3 (moderate): Secondary | ICD-10-CM | POA: Diagnosis not present

## 2016-04-20 DIAGNOSIS — I129 Hypertensive chronic kidney disease with stage 1 through stage 4 chronic kidney disease, or unspecified chronic kidney disease: Secondary | ICD-10-CM | POA: Diagnosis not present

## 2016-04-26 DIAGNOSIS — I480 Paroxysmal atrial fibrillation: Secondary | ICD-10-CM | POA: Diagnosis not present

## 2016-04-26 DIAGNOSIS — E7800 Pure hypercholesterolemia, unspecified: Secondary | ICD-10-CM | POA: Diagnosis not present

## 2016-04-26 DIAGNOSIS — I482 Chronic atrial fibrillation: Secondary | ICD-10-CM | POA: Diagnosis not present

## 2016-05-14 DIAGNOSIS — N2581 Secondary hyperparathyroidism of renal origin: Secondary | ICD-10-CM | POA: Diagnosis not present

## 2016-05-14 DIAGNOSIS — M109 Gout, unspecified: Secondary | ICD-10-CM | POA: Diagnosis not present

## 2016-05-14 DIAGNOSIS — N184 Chronic kidney disease, stage 4 (severe): Secondary | ICD-10-CM | POA: Diagnosis not present

## 2016-05-14 DIAGNOSIS — I10 Essential (primary) hypertension: Secondary | ICD-10-CM | POA: Diagnosis not present

## 2016-05-24 DIAGNOSIS — I482 Chronic atrial fibrillation: Secondary | ICD-10-CM | POA: Diagnosis not present

## 2016-06-21 DIAGNOSIS — I482 Chronic atrial fibrillation: Secondary | ICD-10-CM | POA: Diagnosis not present

## 2016-07-19 DIAGNOSIS — I482 Chronic atrial fibrillation: Secondary | ICD-10-CM | POA: Diagnosis not present

## 2016-08-05 DIAGNOSIS — N2581 Secondary hyperparathyroidism of renal origin: Secondary | ICD-10-CM | POA: Diagnosis not present

## 2016-08-05 DIAGNOSIS — I10 Essential (primary) hypertension: Secondary | ICD-10-CM | POA: Diagnosis not present

## 2016-08-05 DIAGNOSIS — N184 Chronic kidney disease, stage 4 (severe): Secondary | ICD-10-CM | POA: Diagnosis not present

## 2016-08-05 DIAGNOSIS — M109 Gout, unspecified: Secondary | ICD-10-CM | POA: Diagnosis not present

## 2016-08-18 DIAGNOSIS — I482 Chronic atrial fibrillation: Secondary | ICD-10-CM | POA: Diagnosis not present

## 2016-09-15 DIAGNOSIS — I482 Chronic atrial fibrillation: Secondary | ICD-10-CM | POA: Diagnosis not present

## 2016-10-11 DIAGNOSIS — I482 Chronic atrial fibrillation: Secondary | ICD-10-CM | POA: Diagnosis not present

## 2016-10-11 DIAGNOSIS — E7800 Pure hypercholesterolemia, unspecified: Secondary | ICD-10-CM | POA: Diagnosis not present

## 2016-10-18 DIAGNOSIS — E7800 Pure hypercholesterolemia, unspecified: Secondary | ICD-10-CM | POA: Diagnosis not present

## 2016-10-18 DIAGNOSIS — N184 Chronic kidney disease, stage 4 (severe): Secondary | ICD-10-CM | POA: Diagnosis not present

## 2016-10-18 DIAGNOSIS — E669 Obesity, unspecified: Secondary | ICD-10-CM | POA: Diagnosis not present

## 2016-10-18 DIAGNOSIS — I480 Paroxysmal atrial fibrillation: Secondary | ICD-10-CM | POA: Diagnosis not present

## 2016-10-18 DIAGNOSIS — R739 Hyperglycemia, unspecified: Secondary | ICD-10-CM | POA: Diagnosis not present

## 2016-10-18 DIAGNOSIS — I700 Atherosclerosis of aorta: Secondary | ICD-10-CM | POA: Diagnosis not present

## 2016-10-18 DIAGNOSIS — I129 Hypertensive chronic kidney disease with stage 1 through stage 4 chronic kidney disease, or unspecified chronic kidney disease: Secondary | ICD-10-CM | POA: Diagnosis not present

## 2016-10-25 DIAGNOSIS — I10 Essential (primary) hypertension: Secondary | ICD-10-CM | POA: Diagnosis not present

## 2016-10-25 DIAGNOSIS — N2581 Secondary hyperparathyroidism of renal origin: Secondary | ICD-10-CM | POA: Diagnosis not present

## 2016-10-25 DIAGNOSIS — M109 Gout, unspecified: Secondary | ICD-10-CM | POA: Diagnosis not present

## 2016-10-25 DIAGNOSIS — N184 Chronic kidney disease, stage 4 (severe): Secondary | ICD-10-CM | POA: Diagnosis not present

## 2016-10-28 DIAGNOSIS — I480 Paroxysmal atrial fibrillation: Secondary | ICD-10-CM | POA: Diagnosis not present

## 2016-10-28 DIAGNOSIS — I700 Atherosclerosis of aorta: Secondary | ICD-10-CM | POA: Diagnosis not present

## 2016-10-28 DIAGNOSIS — I10 Essential (primary) hypertension: Secondary | ICD-10-CM | POA: Diagnosis not present

## 2016-10-28 DIAGNOSIS — E7800 Pure hypercholesterolemia, unspecified: Secondary | ICD-10-CM | POA: Diagnosis not present

## 2016-10-28 DIAGNOSIS — N184 Chronic kidney disease, stage 4 (severe): Secondary | ICD-10-CM | POA: Diagnosis not present

## 2016-10-28 DIAGNOSIS — I129 Hypertensive chronic kidney disease with stage 1 through stage 4 chronic kidney disease, or unspecified chronic kidney disease: Secondary | ICD-10-CM | POA: Diagnosis not present

## 2016-11-15 DIAGNOSIS — I482 Chronic atrial fibrillation: Secondary | ICD-10-CM | POA: Diagnosis not present

## 2016-12-13 DIAGNOSIS — I482 Chronic atrial fibrillation: Secondary | ICD-10-CM | POA: Diagnosis not present

## 2017-01-10 DIAGNOSIS — I482 Chronic atrial fibrillation: Secondary | ICD-10-CM | POA: Diagnosis not present

## 2017-02-08 DIAGNOSIS — I482 Chronic atrial fibrillation: Secondary | ICD-10-CM | POA: Diagnosis not present

## 2017-02-24 DIAGNOSIS — M109 Gout, unspecified: Secondary | ICD-10-CM | POA: Diagnosis not present

## 2017-02-24 DIAGNOSIS — N2581 Secondary hyperparathyroidism of renal origin: Secondary | ICD-10-CM | POA: Diagnosis not present

## 2017-02-24 DIAGNOSIS — N184 Chronic kidney disease, stage 4 (severe): Secondary | ICD-10-CM | POA: Diagnosis not present

## 2017-02-24 DIAGNOSIS — I10 Essential (primary) hypertension: Secondary | ICD-10-CM | POA: Diagnosis not present

## 2017-03-09 DIAGNOSIS — I482 Chronic atrial fibrillation: Secondary | ICD-10-CM | POA: Diagnosis not present

## 2017-04-06 DIAGNOSIS — I129 Hypertensive chronic kidney disease with stage 1 through stage 4 chronic kidney disease, or unspecified chronic kidney disease: Secondary | ICD-10-CM | POA: Diagnosis not present

## 2017-04-06 DIAGNOSIS — N184 Chronic kidney disease, stage 4 (severe): Secondary | ICD-10-CM | POA: Diagnosis not present

## 2017-04-06 DIAGNOSIS — I482 Chronic atrial fibrillation: Secondary | ICD-10-CM | POA: Diagnosis not present

## 2017-04-06 DIAGNOSIS — I480 Paroxysmal atrial fibrillation: Secondary | ICD-10-CM | POA: Diagnosis not present

## 2017-04-06 DIAGNOSIS — E7800 Pure hypercholesterolemia, unspecified: Secondary | ICD-10-CM | POA: Diagnosis not present

## 2017-04-06 DIAGNOSIS — R739 Hyperglycemia, unspecified: Secondary | ICD-10-CM | POA: Diagnosis not present

## 2017-05-02 DIAGNOSIS — E7800 Pure hypercholesterolemia, unspecified: Secondary | ICD-10-CM | POA: Diagnosis not present

## 2017-05-02 DIAGNOSIS — N184 Chronic kidney disease, stage 4 (severe): Secondary | ICD-10-CM | POA: Diagnosis not present

## 2017-05-02 DIAGNOSIS — R739 Hyperglycemia, unspecified: Secondary | ICD-10-CM | POA: Diagnosis not present

## 2017-05-02 DIAGNOSIS — I129 Hypertensive chronic kidney disease with stage 1 through stage 4 chronic kidney disease, or unspecified chronic kidney disease: Secondary | ICD-10-CM | POA: Diagnosis not present

## 2017-05-02 DIAGNOSIS — I700 Atherosclerosis of aorta: Secondary | ICD-10-CM | POA: Diagnosis not present

## 2017-05-02 DIAGNOSIS — Z0000 Encounter for general adult medical examination without abnormal findings: Secondary | ICD-10-CM | POA: Diagnosis not present

## 2017-05-02 DIAGNOSIS — I480 Paroxysmal atrial fibrillation: Secondary | ICD-10-CM | POA: Diagnosis not present

## 2017-05-25 DIAGNOSIS — I480 Paroxysmal atrial fibrillation: Secondary | ICD-10-CM | POA: Diagnosis not present

## 2017-06-13 DIAGNOSIS — I10 Essential (primary) hypertension: Secondary | ICD-10-CM | POA: Diagnosis not present

## 2017-06-13 DIAGNOSIS — M109 Gout, unspecified: Secondary | ICD-10-CM | POA: Diagnosis not present

## 2017-06-13 DIAGNOSIS — N184 Chronic kidney disease, stage 4 (severe): Secondary | ICD-10-CM | POA: Diagnosis not present

## 2017-06-13 DIAGNOSIS — N2581 Secondary hyperparathyroidism of renal origin: Secondary | ICD-10-CM | POA: Diagnosis not present

## 2017-06-15 DIAGNOSIS — I480 Paroxysmal atrial fibrillation: Secondary | ICD-10-CM | POA: Diagnosis not present

## 2017-07-13 DIAGNOSIS — I480 Paroxysmal atrial fibrillation: Secondary | ICD-10-CM | POA: Diagnosis not present

## 2017-08-17 DIAGNOSIS — I480 Paroxysmal atrial fibrillation: Secondary | ICD-10-CM | POA: Diagnosis not present

## 2017-08-25 DIAGNOSIS — E1140 Type 2 diabetes mellitus with diabetic neuropathy, unspecified: Secondary | ICD-10-CM | POA: Diagnosis not present

## 2017-08-25 DIAGNOSIS — R29717 NIHSS score 17: Secondary | ICD-10-CM | POA: Diagnosis present

## 2017-08-25 DIAGNOSIS — E041 Nontoxic single thyroid nodule: Secondary | ICD-10-CM | POA: Diagnosis not present

## 2017-08-25 DIAGNOSIS — I4891 Unspecified atrial fibrillation: Secondary | ICD-10-CM | POA: Diagnosis present

## 2017-08-25 DIAGNOSIS — M6281 Muscle weakness (generalized): Secondary | ICD-10-CM | POA: Diagnosis not present

## 2017-08-25 DIAGNOSIS — I6523 Occlusion and stenosis of bilateral carotid arteries: Secondary | ICD-10-CM | POA: Diagnosis not present

## 2017-08-25 DIAGNOSIS — R1312 Dysphagia, oropharyngeal phase: Secondary | ICD-10-CM | POA: Diagnosis not present

## 2017-08-25 DIAGNOSIS — I63412 Cerebral infarction due to embolism of left middle cerebral artery: Secondary | ICD-10-CM | POA: Diagnosis not present

## 2017-08-25 DIAGNOSIS — K5900 Constipation, unspecified: Secondary | ICD-10-CM | POA: Diagnosis present

## 2017-08-25 DIAGNOSIS — I69398 Other sequelae of cerebral infarction: Secondary | ICD-10-CM | POA: Diagnosis not present

## 2017-08-25 DIAGNOSIS — M4856XA Collapsed vertebra, not elsewhere classified, lumbar region, initial encounter for fracture: Secondary | ICD-10-CM | POA: Diagnosis not present

## 2017-08-25 DIAGNOSIS — R5381 Other malaise: Secondary | ICD-10-CM | POA: Diagnosis not present

## 2017-08-25 DIAGNOSIS — I480 Paroxysmal atrial fibrillation: Secondary | ICD-10-CM | POA: Diagnosis not present

## 2017-08-25 DIAGNOSIS — R51 Headache: Secondary | ICD-10-CM | POA: Diagnosis not present

## 2017-08-25 DIAGNOSIS — I69391 Dysphagia following cerebral infarction: Secondary | ICD-10-CM | POA: Diagnosis not present

## 2017-08-25 DIAGNOSIS — R4182 Altered mental status, unspecified: Secondary | ICD-10-CM | POA: Diagnosis not present

## 2017-08-25 DIAGNOSIS — E669 Obesity, unspecified: Secondary | ICD-10-CM | POA: Diagnosis not present

## 2017-08-25 DIAGNOSIS — N189 Chronic kidney disease, unspecified: Secondary | ICD-10-CM | POA: Diagnosis not present

## 2017-08-25 DIAGNOSIS — I129 Hypertensive chronic kidney disease with stage 1 through stage 4 chronic kidney disease, or unspecified chronic kidney disease: Secondary | ICD-10-CM | POA: Diagnosis not present

## 2017-08-25 DIAGNOSIS — M109 Gout, unspecified: Secondary | ICD-10-CM | POA: Diagnosis not present

## 2017-08-25 DIAGNOSIS — G8191 Hemiplegia, unspecified affecting right dominant side: Secondary | ICD-10-CM | POA: Diagnosis not present

## 2017-08-25 DIAGNOSIS — R279 Unspecified lack of coordination: Secondary | ICD-10-CM | POA: Diagnosis not present

## 2017-08-25 DIAGNOSIS — I6602 Occlusion and stenosis of left middle cerebral artery: Secondary | ICD-10-CM | POA: Diagnosis not present

## 2017-08-25 DIAGNOSIS — R4701 Aphasia: Secondary | ICD-10-CM | POA: Diagnosis not present

## 2017-08-25 DIAGNOSIS — E1122 Type 2 diabetes mellitus with diabetic chronic kidney disease: Secondary | ICD-10-CM | POA: Diagnosis not present

## 2017-08-25 DIAGNOSIS — Z7901 Long term (current) use of anticoagulants: Secondary | ICD-10-CM | POA: Diagnosis not present

## 2017-08-25 DIAGNOSIS — M79604 Pain in right leg: Secondary | ICD-10-CM | POA: Diagnosis not present

## 2017-08-25 DIAGNOSIS — E079 Disorder of thyroid, unspecified: Secondary | ICD-10-CM | POA: Diagnosis not present

## 2017-08-25 DIAGNOSIS — M1611 Unilateral primary osteoarthritis, right hip: Secondary | ICD-10-CM | POA: Diagnosis not present

## 2017-08-25 DIAGNOSIS — I6612 Occlusion and stenosis of left anterior cerebral artery: Secondary | ICD-10-CM | POA: Diagnosis not present

## 2017-08-25 DIAGNOSIS — M47816 Spondylosis without myelopathy or radiculopathy, lumbar region: Secondary | ICD-10-CM | POA: Diagnosis not present

## 2017-08-25 DIAGNOSIS — Z5181 Encounter for therapeutic drug level monitoring: Secondary | ICD-10-CM | POA: Diagnosis not present

## 2017-08-25 DIAGNOSIS — M4856XD Collapsed vertebra, not elsewhere classified, lumbar region, subsequent encounter for fracture with routine healing: Secondary | ICD-10-CM | POA: Diagnosis not present

## 2017-08-25 DIAGNOSIS — M1030 Gout due to renal impairment, unspecified site: Secondary | ICD-10-CM | POA: Diagnosis not present

## 2017-08-25 DIAGNOSIS — I63512 Cerebral infarction due to unspecified occlusion or stenosis of left middle cerebral artery: Secondary | ICD-10-CM | POA: Diagnosis not present

## 2017-08-25 DIAGNOSIS — Z9181 History of falling: Secondary | ICD-10-CM | POA: Diagnosis not present

## 2017-08-25 DIAGNOSIS — S32040A Wedge compression fracture of fourth lumbar vertebra, initial encounter for closed fracture: Secondary | ICD-10-CM | POA: Diagnosis not present

## 2017-08-25 DIAGNOSIS — N184 Chronic kidney disease, stage 4 (severe): Secondary | ICD-10-CM | POA: Diagnosis not present

## 2017-08-25 DIAGNOSIS — I10 Essential (primary) hypertension: Secondary | ICD-10-CM | POA: Diagnosis not present

## 2017-08-25 DIAGNOSIS — R262 Difficulty in walking, not elsewhere classified: Secondary | ICD-10-CM | POA: Diagnosis not present

## 2017-08-25 DIAGNOSIS — E049 Nontoxic goiter, unspecified: Secondary | ICD-10-CM | POA: Diagnosis not present

## 2017-08-25 DIAGNOSIS — R29810 Facial weakness: Secondary | ICD-10-CM | POA: Diagnosis present

## 2017-08-25 DIAGNOSIS — S79911A Unspecified injury of right hip, initial encounter: Secondary | ICD-10-CM | POA: Diagnosis not present

## 2017-08-25 DIAGNOSIS — R7303 Prediabetes: Secondary | ICD-10-CM | POA: Diagnosis present

## 2017-08-25 DIAGNOSIS — S299XXA Unspecified injury of thorax, initial encounter: Secondary | ICD-10-CM | POA: Diagnosis not present

## 2017-08-25 DIAGNOSIS — E785 Hyperlipidemia, unspecified: Secondary | ICD-10-CM | POA: Diagnosis present

## 2017-08-25 DIAGNOSIS — I639 Cerebral infarction, unspecified: Secondary | ICD-10-CM | POA: Diagnosis not present

## 2017-08-25 DIAGNOSIS — R0989 Other specified symptoms and signs involving the circulatory and respiratory systems: Secondary | ICD-10-CM | POA: Diagnosis not present

## 2017-08-25 DIAGNOSIS — R000 Tachycardia, unspecified: Secondary | ICD-10-CM | POA: Diagnosis not present

## 2017-08-31 ENCOUNTER — Encounter
Admission: RE | Admit: 2017-08-31 | Discharge: 2017-08-31 | Disposition: A | Discharge status: Home / Self Care | Payer: Medicare Other | Source: Ambulatory Visit | Attending: Internal Medicine | Admitting: Internal Medicine

## 2017-08-31 DIAGNOSIS — I4891 Unspecified atrial fibrillation: Secondary | ICD-10-CM | POA: Insufficient documentation

## 2017-08-31 DIAGNOSIS — S32009A Unspecified fracture of unspecified lumbar vertebra, initial encounter for closed fracture: Secondary | ICD-10-CM | POA: Insufficient documentation

## 2017-09-01 DIAGNOSIS — I639 Cerebral infarction, unspecified: Secondary | ICD-10-CM | POA: Diagnosis not present

## 2017-09-01 DIAGNOSIS — R262 Difficulty in walking, not elsewhere classified: Secondary | ICD-10-CM | POA: Diagnosis not present

## 2017-09-01 DIAGNOSIS — E041 Nontoxic single thyroid nodule: Secondary | ICD-10-CM | POA: Diagnosis not present

## 2017-09-01 DIAGNOSIS — I4891 Unspecified atrial fibrillation: Secondary | ICD-10-CM | POA: Diagnosis not present

## 2017-09-01 DIAGNOSIS — I69391 Dysphagia following cerebral infarction: Secondary | ICD-10-CM | POA: Diagnosis not present

## 2017-09-01 DIAGNOSIS — R1312 Dysphagia, oropharyngeal phase: Secondary | ICD-10-CM | POA: Diagnosis not present

## 2017-09-01 DIAGNOSIS — M4856XD Collapsed vertebra, not elsewhere classified, lumbar region, subsequent encounter for fracture with routine healing: Secondary | ICD-10-CM | POA: Diagnosis not present

## 2017-09-01 DIAGNOSIS — R279 Unspecified lack of coordination: Secondary | ICD-10-CM | POA: Diagnosis not present

## 2017-09-01 DIAGNOSIS — E079 Disorder of thyroid, unspecified: Secondary | ICD-10-CM | POA: Diagnosis not present

## 2017-09-01 DIAGNOSIS — I10 Essential (primary) hypertension: Secondary | ICD-10-CM | POA: Diagnosis not present

## 2017-09-01 DIAGNOSIS — K5909 Other constipation: Secondary | ICD-10-CM | POA: Diagnosis not present

## 2017-09-01 DIAGNOSIS — I6930 Unspecified sequelae of cerebral infarction: Secondary | ICD-10-CM | POA: Diagnosis not present

## 2017-09-01 DIAGNOSIS — Z8673 Personal history of transient ischemic attack (TIA), and cerebral infarction without residual deficits: Secondary | ICD-10-CM | POA: Diagnosis not present

## 2017-09-01 DIAGNOSIS — Z9181 History of falling: Secondary | ICD-10-CM | POA: Diagnosis not present

## 2017-09-01 DIAGNOSIS — G8929 Other chronic pain: Secondary | ICD-10-CM | POA: Diagnosis not present

## 2017-09-01 DIAGNOSIS — I129 Hypertensive chronic kidney disease with stage 1 through stage 4 chronic kidney disease, or unspecified chronic kidney disease: Secondary | ICD-10-CM | POA: Diagnosis not present

## 2017-09-01 DIAGNOSIS — G250 Essential tremor: Secondary | ICD-10-CM | POA: Diagnosis not present

## 2017-09-01 DIAGNOSIS — G5600 Carpal tunnel syndrome, unspecified upper limb: Secondary | ICD-10-CM | POA: Diagnosis not present

## 2017-09-01 DIAGNOSIS — E049 Nontoxic goiter, unspecified: Secondary | ICD-10-CM | POA: Diagnosis not present

## 2017-09-01 DIAGNOSIS — R221 Localized swelling, mass and lump, neck: Secondary | ICD-10-CM | POA: Diagnosis not present

## 2017-09-01 DIAGNOSIS — M1A09X0 Idiopathic chronic gout, multiple sites, without tophus (tophi): Secondary | ICD-10-CM | POA: Diagnosis not present

## 2017-09-01 DIAGNOSIS — E1140 Type 2 diabetes mellitus with diabetic neuropathy, unspecified: Secondary | ICD-10-CM | POA: Diagnosis not present

## 2017-09-01 DIAGNOSIS — Z7901 Long term (current) use of anticoagulants: Secondary | ICD-10-CM | POA: Diagnosis not present

## 2017-09-01 DIAGNOSIS — I69941 Monoplegia of lower limb following unspecified cerebrovascular disease affecting right dominant side: Secondary | ICD-10-CM | POA: Diagnosis not present

## 2017-09-01 DIAGNOSIS — M25569 Pain in unspecified knee: Secondary | ICD-10-CM | POA: Diagnosis not present

## 2017-09-01 DIAGNOSIS — M1611 Unilateral primary osteoarthritis, right hip: Secondary | ICD-10-CM | POA: Diagnosis not present

## 2017-09-01 DIAGNOSIS — R200 Anesthesia of skin: Secondary | ICD-10-CM | POA: Diagnosis not present

## 2017-09-01 DIAGNOSIS — M1030 Gout due to renal impairment, unspecified site: Secondary | ICD-10-CM | POA: Diagnosis not present

## 2017-09-01 DIAGNOSIS — Z5181 Encounter for therapeutic drug level monitoring: Secondary | ICD-10-CM | POA: Diagnosis not present

## 2017-09-01 DIAGNOSIS — I69398 Other sequelae of cerebral infarction: Secondary | ICD-10-CM | POA: Diagnosis not present

## 2017-09-01 DIAGNOSIS — E785 Hyperlipidemia, unspecified: Secondary | ICD-10-CM | POA: Diagnosis not present

## 2017-09-01 DIAGNOSIS — N184 Chronic kidney disease, stage 4 (severe): Secondary | ICD-10-CM | POA: Diagnosis not present

## 2017-09-01 DIAGNOSIS — Z9889 Other specified postprocedural states: Secondary | ICD-10-CM | POA: Diagnosis not present

## 2017-09-01 DIAGNOSIS — E1122 Type 2 diabetes mellitus with diabetic chronic kidney disease: Secondary | ICD-10-CM | POA: Diagnosis not present

## 2017-09-01 DIAGNOSIS — E669 Obesity, unspecified: Secondary | ICD-10-CM | POA: Diagnosis not present

## 2017-09-01 DIAGNOSIS — I779 Disorder of arteries and arterioles, unspecified: Secondary | ICD-10-CM | POA: Diagnosis not present

## 2017-09-01 DIAGNOSIS — R5381 Other malaise: Secondary | ICD-10-CM | POA: Diagnosis not present

## 2017-09-01 DIAGNOSIS — Z79899 Other long term (current) drug therapy: Secondary | ICD-10-CM | POA: Diagnosis not present

## 2017-09-01 DIAGNOSIS — M6281 Muscle weakness (generalized): Secondary | ICD-10-CM | POA: Diagnosis not present

## 2017-09-01 DIAGNOSIS — I480 Paroxysmal atrial fibrillation: Secondary | ICD-10-CM | POA: Diagnosis not present

## 2017-09-02 ENCOUNTER — Other Ambulatory Visit
Admission: RE | Admit: 2017-09-02 | Discharge: 2017-09-02 | Disposition: A | Discharge status: Home / Self Care | Payer: No Typology Code available for payment source | Source: Skilled Nursing Facility | Attending: Internal Medicine | Admitting: Internal Medicine

## 2017-09-02 DIAGNOSIS — I480 Paroxysmal atrial fibrillation: Secondary | ICD-10-CM | POA: Diagnosis not present

## 2017-09-02 DIAGNOSIS — I639 Cerebral infarction, unspecified: Secondary | ICD-10-CM | POA: Insufficient documentation

## 2017-09-02 DIAGNOSIS — I779 Disorder of arteries and arterioles, unspecified: Secondary | ICD-10-CM | POA: Insufficient documentation

## 2017-09-02 DIAGNOSIS — I4891 Unspecified atrial fibrillation: Secondary | ICD-10-CM | POA: Insufficient documentation

## 2017-09-02 DIAGNOSIS — R221 Localized swelling, mass and lump, neck: Secondary | ICD-10-CM | POA: Diagnosis not present

## 2017-09-02 LAB — COMPREHENSIVE METABOLIC PANEL
ALBUMIN: 3 g/dL — AB (ref 3.5–5.0)
ALT: 25 U/L (ref 0–44)
ANION GAP: 10 (ref 5–15)
AST: 23 U/L (ref 15–41)
Alkaline Phosphatase: 70 U/L (ref 38–126)
BUN: 37 mg/dL — ABNORMAL HIGH (ref 8–23)
CO2: 24 mmol/L (ref 22–32)
Calcium: 8.8 mg/dL — ABNORMAL LOW (ref 8.9–10.3)
Chloride: 104 mmol/L (ref 98–111)
Creatinine, Ser: 1.5 mg/dL — ABNORMAL HIGH (ref 0.44–1.00)
GFR calc Af Amer: 36 mL/min — ABNORMAL LOW (ref >60)
GFR calc non Af Amer: 31 mL/min — ABNORMAL LOW (ref >60)
GLUCOSE: 92 mg/dL (ref 70–99)
POTASSIUM: 3.7 mmol/L (ref 3.5–5.1)
SODIUM: 138 mmol/L (ref 135–145)
TOTAL PROTEIN: 6.1 g/dL — AB (ref 6.5–8.1)
Total Bilirubin: 0.7 mg/dL (ref 0.3–1.2)

## 2017-09-02 LAB — CBC WITH DIFFERENTIAL/PLATELET
BASOS PCT: 1 %
Basophils Absolute: 0.1 10*3/uL (ref 0–0.1)
EOS ABS: 0.1 10*3/uL (ref 0–0.7)
Eosinophils Relative: 2 %
HCT: 37 % (ref 35.0–47.0)
Hemoglobin: 12.4 g/dL (ref 12.0–16.0)
Lymphocytes Relative: 18 %
Lymphs Abs: 1.2 10*3/uL (ref 1.0–3.6)
MCH: 32.7 pg (ref 26.0–34.0)
MCHC: 33.5 g/dL (ref 32.0–36.0)
MCV: 97.5 fL (ref 80.0–100.0)
MONO ABS: 0.7 10*3/uL (ref 0.2–0.9)
MONOS PCT: 11 %
Neutro Abs: 4.6 10*3/uL (ref 1.4–6.5)
Neutrophils Relative %: 68 %
Platelets: 298 10*3/uL (ref 150–440)
RBC: 3.79 MIL/uL — ABNORMAL LOW (ref 3.80–5.20)
RDW: 15.4 % — AB (ref 11.5–14.5)
WBC: 6.7 10*3/uL (ref 3.6–11.0)

## 2017-09-02 LAB — PROTIME-INR
INR: 2.91
Prothrombin Time: 30.2 seconds — ABNORMAL HIGH (ref 11.4–15.2)

## 2017-09-05 ENCOUNTER — Encounter
Admission: RE | Admit: 2017-09-05 | Discharge: 2017-09-05 | Disposition: A | Discharge status: Home / Self Care | Payer: Medicare Other | Source: Ambulatory Visit | Attending: Internal Medicine | Admitting: Internal Medicine

## 2017-09-05 ENCOUNTER — Other Ambulatory Visit
Admission: RE | Admit: 2017-09-05 | Discharge: 2017-09-05 | Disposition: A | Discharge status: Home / Self Care | Payer: Medicare Other | Source: Ambulatory Visit | Attending: Internal Medicine | Admitting: Internal Medicine

## 2017-09-05 DIAGNOSIS — Z8673 Personal history of transient ischemic attack (TIA), and cerebral infarction without residual deficits: Secondary | ICD-10-CM | POA: Insufficient documentation

## 2017-09-05 DIAGNOSIS — I4891 Unspecified atrial fibrillation: Secondary | ICD-10-CM | POA: Insufficient documentation

## 2017-09-05 LAB — CBC WITH DIFFERENTIAL/PLATELET
BASOS PCT: 1 %
Basophils Absolute: 0 10*3/uL (ref 0–0.1)
EOS PCT: 2 %
Eosinophils Absolute: 0.1 10*3/uL (ref 0–0.7)
HEMATOCRIT: 35.5 % (ref 35.0–47.0)
Hemoglobin: 11.9 g/dL — ABNORMAL LOW (ref 12.0–16.0)
Lymphocytes Relative: 17 %
Lymphs Abs: 1.1 10*3/uL (ref 1.0–3.6)
MCH: 32.6 pg (ref 26.0–34.0)
MCHC: 33.5 g/dL (ref 32.0–36.0)
MCV: 97.4 fL (ref 80.0–100.0)
MONO ABS: 0.7 10*3/uL (ref 0.2–0.9)
MONOS PCT: 11 %
NEUTROS ABS: 4.4 10*3/uL (ref 1.4–6.5)
Neutrophils Relative %: 69 %
PLATELETS: 333 10*3/uL (ref 150–440)
RBC: 3.65 MIL/uL — ABNORMAL LOW (ref 3.80–5.20)
RDW: 15.3 % — AB (ref 11.5–14.5)
WBC: 6.3 10*3/uL (ref 3.6–11.0)

## 2017-09-05 LAB — COMPREHENSIVE METABOLIC PANEL
ALT: 27 U/L (ref 0–44)
ANION GAP: 8 (ref 5–15)
AST: 28 U/L (ref 15–41)
Albumin: 2.9 g/dL — ABNORMAL LOW (ref 3.5–5.0)
Alkaline Phosphatase: 66 U/L (ref 38–126)
BUN: 53 mg/dL — ABNORMAL HIGH (ref 8–23)
CHLORIDE: 104 mmol/L (ref 98–111)
CO2: 27 mmol/L (ref 22–32)
Calcium: 8.9 mg/dL (ref 8.9–10.3)
Creatinine, Ser: 1.67 mg/dL — ABNORMAL HIGH (ref 0.44–1.00)
GFR, EST AFRICAN AMERICAN: 31 mL/min — AB (ref >60)
GFR, EST NON AFRICAN AMERICAN: 27 mL/min — AB (ref >60)
Glucose, Bld: 90 mg/dL (ref 70–99)
POTASSIUM: 3.8 mmol/L (ref 3.5–5.1)
SODIUM: 139 mmol/L (ref 135–145)
Total Bilirubin: 0.6 mg/dL (ref 0.3–1.2)
Total Protein: 5.8 g/dL — ABNORMAL LOW (ref 6.5–8.1)

## 2017-09-05 LAB — PROTIME-INR
INR: 4.55 — AB
Prothrombin Time: 42.8 seconds — ABNORMAL HIGH (ref 11.4–15.2)

## 2017-09-06 ENCOUNTER — Other Ambulatory Visit
Admission: RE | Admit: 2017-09-06 | Discharge: 2017-09-06 | Disposition: A | Discharge status: Home / Self Care | Payer: No Typology Code available for payment source | Source: Skilled Nursing Facility | Attending: Internal Medicine | Admitting: Internal Medicine

## 2017-09-06 DIAGNOSIS — I4891 Unspecified atrial fibrillation: Secondary | ICD-10-CM | POA: Insufficient documentation

## 2017-09-06 DIAGNOSIS — Z8673 Personal history of transient ischemic attack (TIA), and cerebral infarction without residual deficits: Secondary | ICD-10-CM | POA: Insufficient documentation

## 2017-09-06 LAB — PROTIME-INR
INR: 4.98
Prothrombin Time: 45.9 seconds — ABNORMAL HIGH (ref 11.4–15.2)

## 2017-09-10 ENCOUNTER — Other Ambulatory Visit
Admission: RE | Admit: 2017-09-10 | Discharge: 2017-09-10 | Disposition: A | Discharge status: Home / Self Care | Payer: No Typology Code available for payment source | Source: Skilled Nursing Facility | Attending: Internal Medicine | Admitting: Internal Medicine

## 2017-09-10 DIAGNOSIS — Z8673 Personal history of transient ischemic attack (TIA), and cerebral infarction without residual deficits: Secondary | ICD-10-CM | POA: Insufficient documentation

## 2017-09-10 DIAGNOSIS — I4891 Unspecified atrial fibrillation: Secondary | ICD-10-CM | POA: Insufficient documentation

## 2017-09-10 LAB — PROTIME-INR
INR: 3.34
Prothrombin Time: 33.6 seconds — ABNORMAL HIGH (ref 11.4–15.2)

## 2017-09-11 ENCOUNTER — Other Ambulatory Visit
Admission: RE | Admit: 2017-09-11 | Discharge: 2017-09-11 | Disposition: A | Discharge status: Home / Self Care | Payer: No Typology Code available for payment source | Source: Skilled Nursing Facility | Attending: Internal Medicine | Admitting: Internal Medicine

## 2017-09-11 DIAGNOSIS — Z8673 Personal history of transient ischemic attack (TIA), and cerebral infarction without residual deficits: Secondary | ICD-10-CM | POA: Insufficient documentation

## 2017-09-11 DIAGNOSIS — I4891 Unspecified atrial fibrillation: Secondary | ICD-10-CM | POA: Insufficient documentation

## 2017-09-11 LAB — PROTIME-INR
INR: 3.49
Prothrombin Time: 34.8 seconds — ABNORMAL HIGH (ref 11.4–15.2)

## 2017-09-12 ENCOUNTER — Non-Acute Institutional Stay (SKILLED_NURSING_FACILITY): Payer: Medicare Other | Admitting: Adult Health

## 2017-09-12 ENCOUNTER — Other Ambulatory Visit
Admission: RE | Admit: 2017-09-12 | Discharge: 2017-09-12 | Disposition: A | Discharge status: Home / Self Care | Payer: No Typology Code available for payment source | Source: Ambulatory Visit | Attending: Internal Medicine | Admitting: Internal Medicine

## 2017-09-12 ENCOUNTER — Encounter: Payer: Self-pay | Admitting: Adult Health

## 2017-09-12 DIAGNOSIS — M1A09X0 Idiopathic chronic gout, multiple sites, without tophus (tophi): Secondary | ICD-10-CM

## 2017-09-12 DIAGNOSIS — E785 Hyperlipidemia, unspecified: Secondary | ICD-10-CM

## 2017-09-12 DIAGNOSIS — I6930 Unspecified sequelae of cerebral infarction: Secondary | ICD-10-CM | POA: Diagnosis not present

## 2017-09-12 DIAGNOSIS — I10 Essential (primary) hypertension: Secondary | ICD-10-CM | POA: Diagnosis not present

## 2017-09-12 DIAGNOSIS — I4891 Unspecified atrial fibrillation: Secondary | ICD-10-CM

## 2017-09-12 DIAGNOSIS — K5909 Other constipation: Secondary | ICD-10-CM | POA: Diagnosis not present

## 2017-09-12 LAB — PROTIME-INR
INR: 2.7
PROTHROMBIN TIME: 28.5 s — AB (ref 11.4–15.2)

## 2017-09-12 NOTE — Progress Notes (Signed)
Location:   [facility] Room Number: 213A Place of Service:  SNF (31)   CODE STATUS: FULL  No Known Allergies  Chief Complaint  Patient presents with  . Acute Visit    afib     HPI:  She is an 84 year old patient on long term coumadin therapy for her afib. Her heart rate is tachycardic today in the 140's. She denies any chest pain or palpitations. She does have bruising present. There are no reports of missed coumadin doses. There are no nursing concerns at this time.   Past Medical History:  Diagnosis Date  . Arthritis   . Atrial fibrillation (HCC)    unspecified  . Carotid stenosis    nonhemodynamic  . Carpal tunnel syndrome on right   . Chronic kidney disease   . Diabetes mellitus type 2, uncomplicated (HCC)   . Essential tremor   . Glaucoma   . Gout   . Gout   . Hyperlipidemia   . Hypertension   . Obesity    unspecified  . Stroke [facility])     Past Surgical History:  Procedure Laterality Date  . ABDOMINAL HYSTERECTOMY  1982  . CATARACT EXTRACTION EXTRACAPSULAR Right 2006  . CATARACT EXTRACTION EXTRACAPSULAR Left 2004  . CHOLECYSTECTOMY  1986    Social History   Socioeconomic History  . Marital status: Widowed    Spouse name: Not on file  . Number of children: Not on file  . Years of education: Not on file  . Highest education level: Not on file  Occupational History  . Not on file  Social Needs  . Financial resource strain: Not on file  . Food insecurity:    Worry: Not on file    Inability: Not on file  . Transportation needs:    Medical: Not on file    Non-medical: Not on file  Tobacco Use  . Smoking status: Never Smoker  . Smokeless tobacco: Never Used  Substance and Sexual Activity  . Alcohol use: Not Currently  . Drug use: Not on file  . Sexual activity: Not on file  Lifestyle  . Physical activity:    Days per week: Not on file    Minutes per session: Not on file  . Stress: Not on file  Relationships  .  Social connections:    Talks on phone: Not on file    Gets together: Not on file    Attends religious service: Not on file    Active member of club or organization: Not on file    Attends meetings of clubs or organizations: Not on file    Relationship status: Not on file  . Intimate partner violence:    Fear of current or ex partner: Not on file    Emotionally abused: Not on file    Physically abused: Not on file    Forced sexual activity: Not on file  Other Topics Concern  . Not on file  Social History Narrative  . Not on file   Family History  Problem Relation Age of Onset  . Heart failure Mother   . Hypertension Mother   . Stroke Father   . Hypertension Brother       VITAL SIGNS BP (!) 122/42   Pulse 62   Temp (!) 97.4 F (36.3 C) (Oral)   Resp 18   Wt 230 lb 8 oz (104.6 kg)   SpO2 99%   Outpatient Encounter Medications as of 09/12/2017  Medication Sig  . acetaminophen (TYLENOL) 325 MG tablet Take 650 mg by mouth every 4 (four) hours as needed.  Marland Kitchen. allopurinol (ZYLOPRIM) 100 MG tablet Take 100 mg by mouth 2 (two) times daily.  . Amino Acids-Protein Hydrolys (FEEDING SUPPLEMENT, PRO-STAT SUGAR FREE 64,) LIQD Take 30 mLs by mouth 2 (two) times daily.  Marland Kitchen. amLODipine (NORVASC) 2.5 MG tablet Take 2.5 mg by mouth daily.  Marland Kitchen. atorvastatin (LIPITOR) 40 MG tablet Take 40 mg by mouth daily.  Marland Kitchen. docusate sodium (COLACE) 100 MG capsule Take 100 mg by mouth daily.  Marland Kitchen. gabapentin (NEURONTIN) 100 MG capsule Take 100 mg by mouth 3 (three) times daily.  Marland Kitchen. lidocaine (LIDODERM) 5 % Place 1 patch onto the skin daily. Remove & Discard patch within 12 hours or as directed by MD  . losartan-hydrochlorothiazide (HYZAAR) 100-25 MG tablet Take 1 tablet by mouth daily.  . metoprolol tartrate (LOPRESSOR) 50 MG tablet Take 50 mg by mouth 2 (two) times daily.  . Nutritional Supplements (ENSURE ENLIVE PO) Take 1 Bottle by mouth 2 (two) times daily.  . polyethylene glycol (MIRALAX / GLYCOLAX) packet Take  17 g by mouth daily.   No facility-administered encounter medications on file as of 09/12/2017.      SIGNIFICANT DIAGNOSTIC EXAMS   LABS REVIEWED:   09-05-17: wbc 6.3; hgb 11.9; hct 35.5; mcv 97.4; plt 333; glucose 90; bun 53; creat 1.67; k+ 3.8; na++ 139; ca 8.9; liver normal albumin 2.9  09-10-17: INR 3.34 09-11-17: INR 3.49  Review of Systems  Constitutional: Negative for malaise/fatigue.  Respiratory: Negative for cough and shortness of breath.   Cardiovascular: Negative for chest pain, palpitations and leg swelling.  Gastrointestinal: Negative for abdominal pain, constipation and heartburn.  Musculoskeletal: Negative for back pain, joint pain and myalgias.  Skin: Negative.   Neurological: Negative for dizziness.  Psychiatric/Behavioral: The patient is not nervous/anxious.     Physical Exam  Constitutional: She appears well-developed and well-nourished. No distress.  Obesity   Neck: No thyromegaly present.  Cardiovascular: Normal rate, regular rhythm, normal heart sounds and intact distal pulses.  Pulmonary/Chest: Effort normal and breath sounds normal. No respiratory distress.  Abdominal: Soft. Bowel sounds are normal. She exhibits no distension. There is no tenderness.  Musculoskeletal: She exhibits no edema.  Able to move extremities  Has right lower extremity edema   Lymphadenopathy:    She has no cervical adenopathy.  Neurological: She is alert.  Has bilateral tremor right > left   Skin: Skin is warm and dry. She is not diaphoretic.  Bruising to right arm   Psychiatric: She has a normal mood and affect.      ASSESSMENT/ PLAN:  TODAY:   1. Atrial fibrillation with RVR: is tachycardic will increase lopressor to 75 mg twice daily (hold for systolic b/p <110 or pulse <60); is on long term coumadin therapy   2.  Essential hypertension, benign: is stable b/p 122/42 will continue hyzaar 100/25 mg daily norvasc 2.5 mg daily and will increase lopressor to 75 mg twice  daily due to tachycardia.   3. Chronic gout of multiple sites: is stable will continue allopurinol 100 mg twice daily   4. Dyslipidemia: is stable will continue lipitor 40 mg daily   5. Chronic constipation; is stable will continue colace daily and miralax daily   6. L MCA stroke status post thrombectomy L MI: is neurologically stable is on chronic anticoagulation therapy; statin.   INR results are pending.    MD is aware  of resident's narcotic use and is in agreement with current plan of care. We will attempt to wean resident as apropriate   Ok Edwards NP [facility]  Contact [phone] Monday through Friday 8am- 5pm  After hours call [phone]

## 2017-09-16 ENCOUNTER — Other Ambulatory Visit
Admission: RE | Admit: 2017-09-16 | Discharge: 2017-09-16 | Disposition: A | Discharge status: Home / Self Care | Payer: No Typology Code available for payment source | Source: Ambulatory Visit | Attending: Internal Medicine | Admitting: Internal Medicine

## 2017-09-16 DIAGNOSIS — G250 Essential tremor: Secondary | ICD-10-CM | POA: Diagnosis not present

## 2017-09-16 DIAGNOSIS — G5600 Carpal tunnel syndrome, unspecified upper limb: Secondary | ICD-10-CM | POA: Diagnosis not present

## 2017-09-16 DIAGNOSIS — G8929 Other chronic pain: Secondary | ICD-10-CM | POA: Diagnosis not present

## 2017-09-16 DIAGNOSIS — I4891 Unspecified atrial fibrillation: Secondary | ICD-10-CM | POA: Insufficient documentation

## 2017-09-16 DIAGNOSIS — Z9889 Other specified postprocedural states: Secondary | ICD-10-CM | POA: Diagnosis not present

## 2017-09-16 DIAGNOSIS — I69941 Monoplegia of lower limb following unspecified cerebrovascular disease affecting right dominant side: Secondary | ICD-10-CM | POA: Diagnosis not present

## 2017-09-16 DIAGNOSIS — Z7901 Long term (current) use of anticoagulants: Secondary | ICD-10-CM | POA: Diagnosis not present

## 2017-09-16 DIAGNOSIS — I6930 Unspecified sequelae of cerebral infarction: Secondary | ICD-10-CM | POA: Diagnosis not present

## 2017-09-16 LAB — URINALYSIS, COMPLETE (UACMP) WITH MICROSCOPIC
BILIRUBIN URINE: NEGATIVE
Glucose, UA: NEGATIVE mg/dL
KETONES UR: NEGATIVE mg/dL
Nitrite: NEGATIVE
PROTEIN: NEGATIVE mg/dL
Specific Gravity, Urine: 1.013 (ref 1.005–1.030)
pH: 6 (ref 5.0–8.0)

## 2017-09-16 LAB — PROTIME-INR
INR: 2.04
Prothrombin Time: 22.9 seconds — ABNORMAL HIGH (ref 11.4–15.2)

## 2017-09-19 LAB — URINE CULTURE

## 2017-09-20 ENCOUNTER — Encounter: Payer: Self-pay | Admitting: Adult Health

## 2017-09-20 DIAGNOSIS — M1A09X0 Idiopathic chronic gout, multiple sites, without tophus (tophi): Secondary | ICD-10-CM | POA: Insufficient documentation

## 2017-09-20 DIAGNOSIS — I6930 Unspecified sequelae of cerebral infarction: Secondary | ICD-10-CM | POA: Insufficient documentation

## 2017-09-20 DIAGNOSIS — E785 Hyperlipidemia, unspecified: Secondary | ICD-10-CM | POA: Insufficient documentation

## 2017-09-20 DIAGNOSIS — K5909 Other constipation: Secondary | ICD-10-CM | POA: Insufficient documentation

## 2017-09-20 DIAGNOSIS — I4891 Unspecified atrial fibrillation: Secondary | ICD-10-CM | POA: Insufficient documentation

## 2017-09-20 DIAGNOSIS — Z8673 Personal history of transient ischemic attack (TIA), and cerebral infarction without residual deficits: Secondary | ICD-10-CM | POA: Insufficient documentation

## 2017-09-20 DIAGNOSIS — I10 Essential (primary) hypertension: Secondary | ICD-10-CM | POA: Insufficient documentation

## 2017-09-22 ENCOUNTER — Encounter: Payer: Self-pay | Admitting: Adult Health

## 2017-09-22 NOTE — Progress Notes (Signed)
Opened in error; Disregard.

## 2017-09-26 DIAGNOSIS — Z7901 Long term (current) use of anticoagulants: Secondary | ICD-10-CM | POA: Diagnosis not present

## 2017-09-26 DIAGNOSIS — M6281 Muscle weakness (generalized): Secondary | ICD-10-CM | POA: Diagnosis not present

## 2017-09-26 DIAGNOSIS — M4856XD Collapsed vertebra, not elsewhere classified, lumbar region, subsequent encounter for fracture with routine healing: Secondary | ICD-10-CM | POA: Diagnosis not present

## 2017-09-26 DIAGNOSIS — E079 Disorder of thyroid, unspecified: Secondary | ICD-10-CM | POA: Diagnosis not present

## 2017-09-26 DIAGNOSIS — I69398 Other sequelae of cerebral infarction: Secondary | ICD-10-CM | POA: Diagnosis not present

## 2017-09-26 DIAGNOSIS — I129 Hypertensive chronic kidney disease with stage 1 through stage 4 chronic kidney disease, or unspecified chronic kidney disease: Secondary | ICD-10-CM | POA: Diagnosis not present

## 2017-09-26 DIAGNOSIS — E1122 Type 2 diabetes mellitus with diabetic chronic kidney disease: Secondary | ICD-10-CM | POA: Diagnosis not present

## 2017-09-26 DIAGNOSIS — Z9181 History of falling: Secondary | ICD-10-CM | POA: Diagnosis not present

## 2017-09-26 DIAGNOSIS — R1312 Dysphagia, oropharyngeal phase: Secondary | ICD-10-CM | POA: Diagnosis not present

## 2017-09-26 DIAGNOSIS — M1030 Gout due to renal impairment, unspecified site: Secondary | ICD-10-CM | POA: Diagnosis not present

## 2017-09-26 DIAGNOSIS — N184 Chronic kidney disease, stage 4 (severe): Secondary | ICD-10-CM | POA: Diagnosis not present

## 2017-09-26 DIAGNOSIS — E1140 Type 2 diabetes mellitus with diabetic neuropathy, unspecified: Secondary | ICD-10-CM | POA: Diagnosis not present

## 2017-09-26 DIAGNOSIS — I69391 Dysphagia following cerebral infarction: Secondary | ICD-10-CM | POA: Diagnosis not present

## 2017-09-26 DIAGNOSIS — Z5181 Encounter for therapeutic drug level monitoring: Secondary | ICD-10-CM | POA: Diagnosis not present

## 2017-09-26 DIAGNOSIS — E785 Hyperlipidemia, unspecified: Secondary | ICD-10-CM | POA: Diagnosis not present

## 2017-09-26 DIAGNOSIS — E669 Obesity, unspecified: Secondary | ICD-10-CM | POA: Diagnosis not present

## 2017-09-28 DIAGNOSIS — M4856XD Collapsed vertebra, not elsewhere classified, lumbar region, subsequent encounter for fracture with routine healing: Secondary | ICD-10-CM | POA: Diagnosis not present

## 2017-09-28 DIAGNOSIS — E1140 Type 2 diabetes mellitus with diabetic neuropathy, unspecified: Secondary | ICD-10-CM | POA: Diagnosis not present

## 2017-09-28 DIAGNOSIS — E079 Disorder of thyroid, unspecified: Secondary | ICD-10-CM | POA: Diagnosis not present

## 2017-09-28 DIAGNOSIS — I129 Hypertensive chronic kidney disease with stage 1 through stage 4 chronic kidney disease, or unspecified chronic kidney disease: Secondary | ICD-10-CM | POA: Diagnosis not present

## 2017-09-28 DIAGNOSIS — I69398 Other sequelae of cerebral infarction: Secondary | ICD-10-CM | POA: Diagnosis not present

## 2017-09-28 DIAGNOSIS — M6281 Muscle weakness (generalized): Secondary | ICD-10-CM | POA: Diagnosis not present

## 2017-09-30 DIAGNOSIS — I4891 Unspecified atrial fibrillation: Secondary | ICD-10-CM | POA: Diagnosis not present

## 2017-09-30 DIAGNOSIS — M6281 Muscle weakness (generalized): Secondary | ICD-10-CM | POA: Diagnosis not present

## 2017-09-30 DIAGNOSIS — T45515A Adverse effect of anticoagulants, initial encounter: Secondary | ICD-10-CM | POA: Diagnosis not present

## 2017-09-30 DIAGNOSIS — I639 Cerebral infarction, unspecified: Secondary | ICD-10-CM | POA: Diagnosis not present

## 2017-09-30 DIAGNOSIS — I69398 Other sequelae of cerebral infarction: Secondary | ICD-10-CM | POA: Diagnosis not present

## 2017-09-30 DIAGNOSIS — M4856XD Collapsed vertebra, not elsewhere classified, lumbar region, subsequent encounter for fracture with routine healing: Secondary | ICD-10-CM | POA: Diagnosis not present

## 2017-09-30 DIAGNOSIS — E079 Disorder of thyroid, unspecified: Secondary | ICD-10-CM | POA: Diagnosis not present

## 2017-09-30 DIAGNOSIS — I129 Hypertensive chronic kidney disease with stage 1 through stage 4 chronic kidney disease, or unspecified chronic kidney disease: Secondary | ICD-10-CM | POA: Diagnosis not present

## 2017-09-30 DIAGNOSIS — D6832 Hemorrhagic disorder due to extrinsic circulating anticoagulants: Secondary | ICD-10-CM | POA: Diagnosis not present

## 2017-09-30 DIAGNOSIS — E1140 Type 2 diabetes mellitus with diabetic neuropathy, unspecified: Secondary | ICD-10-CM | POA: Diagnosis not present

## 2017-10-03 DIAGNOSIS — M4856XD Collapsed vertebra, not elsewhere classified, lumbar region, subsequent encounter for fracture with routine healing: Secondary | ICD-10-CM | POA: Diagnosis not present

## 2017-10-03 DIAGNOSIS — E1140 Type 2 diabetes mellitus with diabetic neuropathy, unspecified: Secondary | ICD-10-CM | POA: Diagnosis not present

## 2017-10-03 DIAGNOSIS — I129 Hypertensive chronic kidney disease with stage 1 through stage 4 chronic kidney disease, or unspecified chronic kidney disease: Secondary | ICD-10-CM | POA: Diagnosis not present

## 2017-10-03 DIAGNOSIS — M6281 Muscle weakness (generalized): Secondary | ICD-10-CM | POA: Diagnosis not present

## 2017-10-03 DIAGNOSIS — E079 Disorder of thyroid, unspecified: Secondary | ICD-10-CM | POA: Diagnosis not present

## 2017-10-03 DIAGNOSIS — I69398 Other sequelae of cerebral infarction: Secondary | ICD-10-CM | POA: Diagnosis not present

## 2017-10-04 DIAGNOSIS — E079 Disorder of thyroid, unspecified: Secondary | ICD-10-CM | POA: Diagnosis not present

## 2017-10-04 DIAGNOSIS — I129 Hypertensive chronic kidney disease with stage 1 through stage 4 chronic kidney disease, or unspecified chronic kidney disease: Secondary | ICD-10-CM | POA: Diagnosis not present

## 2017-10-04 DIAGNOSIS — E1140 Type 2 diabetes mellitus with diabetic neuropathy, unspecified: Secondary | ICD-10-CM | POA: Diagnosis not present

## 2017-10-04 DIAGNOSIS — M6281 Muscle weakness (generalized): Secondary | ICD-10-CM | POA: Diagnosis not present

## 2017-10-04 DIAGNOSIS — M4856XD Collapsed vertebra, not elsewhere classified, lumbar region, subsequent encounter for fracture with routine healing: Secondary | ICD-10-CM | POA: Diagnosis not present

## 2017-10-04 DIAGNOSIS — I69398 Other sequelae of cerebral infarction: Secondary | ICD-10-CM | POA: Diagnosis not present

## 2017-10-05 DIAGNOSIS — M6281 Muscle weakness (generalized): Secondary | ICD-10-CM | POA: Diagnosis not present

## 2017-10-05 DIAGNOSIS — M4856XD Collapsed vertebra, not elsewhere classified, lumbar region, subsequent encounter for fracture with routine healing: Secondary | ICD-10-CM | POA: Diagnosis not present

## 2017-10-05 DIAGNOSIS — E079 Disorder of thyroid, unspecified: Secondary | ICD-10-CM | POA: Diagnosis not present

## 2017-10-05 DIAGNOSIS — E1140 Type 2 diabetes mellitus with diabetic neuropathy, unspecified: Secondary | ICD-10-CM | POA: Diagnosis not present

## 2017-10-05 DIAGNOSIS — I129 Hypertensive chronic kidney disease with stage 1 through stage 4 chronic kidney disease, or unspecified chronic kidney disease: Secondary | ICD-10-CM | POA: Diagnosis not present

## 2017-10-05 DIAGNOSIS — I69398 Other sequelae of cerebral infarction: Secondary | ICD-10-CM | POA: Diagnosis not present

## 2017-10-06 DIAGNOSIS — M6281 Muscle weakness (generalized): Secondary | ICD-10-CM | POA: Diagnosis not present

## 2017-10-06 DIAGNOSIS — I69391 Dysphagia following cerebral infarction: Secondary | ICD-10-CM | POA: Diagnosis not present

## 2017-10-06 DIAGNOSIS — M4856XD Collapsed vertebra, not elsewhere classified, lumbar region, subsequent encounter for fracture with routine healing: Secondary | ICD-10-CM | POA: Diagnosis not present

## 2017-10-06 DIAGNOSIS — E1122 Type 2 diabetes mellitus with diabetic chronic kidney disease: Secondary | ICD-10-CM | POA: Diagnosis not present

## 2017-10-06 DIAGNOSIS — I129 Hypertensive chronic kidney disease with stage 1 through stage 4 chronic kidney disease, or unspecified chronic kidney disease: Secondary | ICD-10-CM | POA: Diagnosis not present

## 2017-10-06 DIAGNOSIS — N184 Chronic kidney disease, stage 4 (severe): Secondary | ICD-10-CM | POA: Diagnosis not present

## 2017-10-06 DIAGNOSIS — E1140 Type 2 diabetes mellitus with diabetic neuropathy, unspecified: Secondary | ICD-10-CM | POA: Diagnosis not present

## 2017-10-06 DIAGNOSIS — R1312 Dysphagia, oropharyngeal phase: Secondary | ICD-10-CM | POA: Diagnosis not present

## 2017-10-06 DIAGNOSIS — E079 Disorder of thyroid, unspecified: Secondary | ICD-10-CM | POA: Diagnosis not present

## 2017-10-06 DIAGNOSIS — I69398 Other sequelae of cerebral infarction: Secondary | ICD-10-CM | POA: Diagnosis not present

## 2017-10-07 DIAGNOSIS — I129 Hypertensive chronic kidney disease with stage 1 through stage 4 chronic kidney disease, or unspecified chronic kidney disease: Secondary | ICD-10-CM | POA: Diagnosis not present

## 2017-10-07 DIAGNOSIS — E079 Disorder of thyroid, unspecified: Secondary | ICD-10-CM | POA: Diagnosis not present

## 2017-10-07 DIAGNOSIS — M4856XD Collapsed vertebra, not elsewhere classified, lumbar region, subsequent encounter for fracture with routine healing: Secondary | ICD-10-CM | POA: Diagnosis not present

## 2017-10-07 DIAGNOSIS — M6281 Muscle weakness (generalized): Secondary | ICD-10-CM | POA: Diagnosis not present

## 2017-10-07 DIAGNOSIS — I69398 Other sequelae of cerebral infarction: Secondary | ICD-10-CM | POA: Diagnosis not present

## 2017-10-07 DIAGNOSIS — E1140 Type 2 diabetes mellitus with diabetic neuropathy, unspecified: Secondary | ICD-10-CM | POA: Diagnosis not present

## 2017-10-10 DIAGNOSIS — E079 Disorder of thyroid, unspecified: Secondary | ICD-10-CM | POA: Diagnosis not present

## 2017-10-10 DIAGNOSIS — M6281 Muscle weakness (generalized): Secondary | ICD-10-CM | POA: Diagnosis not present

## 2017-10-10 DIAGNOSIS — E1140 Type 2 diabetes mellitus with diabetic neuropathy, unspecified: Secondary | ICD-10-CM | POA: Diagnosis not present

## 2017-10-10 DIAGNOSIS — I69398 Other sequelae of cerebral infarction: Secondary | ICD-10-CM | POA: Diagnosis not present

## 2017-10-10 DIAGNOSIS — I129 Hypertensive chronic kidney disease with stage 1 through stage 4 chronic kidney disease, or unspecified chronic kidney disease: Secondary | ICD-10-CM | POA: Diagnosis not present

## 2017-10-10 DIAGNOSIS — M4856XD Collapsed vertebra, not elsewhere classified, lumbar region, subsequent encounter for fracture with routine healing: Secondary | ICD-10-CM | POA: Diagnosis not present

## 2017-10-11 DIAGNOSIS — M4856XD Collapsed vertebra, not elsewhere classified, lumbar region, subsequent encounter for fracture with routine healing: Secondary | ICD-10-CM | POA: Diagnosis not present

## 2017-10-11 DIAGNOSIS — I129 Hypertensive chronic kidney disease with stage 1 through stage 4 chronic kidney disease, or unspecified chronic kidney disease: Secondary | ICD-10-CM | POA: Diagnosis not present

## 2017-10-11 DIAGNOSIS — E1140 Type 2 diabetes mellitus with diabetic neuropathy, unspecified: Secondary | ICD-10-CM | POA: Diagnosis not present

## 2017-10-11 DIAGNOSIS — M6281 Muscle weakness (generalized): Secondary | ICD-10-CM | POA: Diagnosis not present

## 2017-10-11 DIAGNOSIS — E079 Disorder of thyroid, unspecified: Secondary | ICD-10-CM | POA: Diagnosis not present

## 2017-10-11 DIAGNOSIS — I69398 Other sequelae of cerebral infarction: Secondary | ICD-10-CM | POA: Diagnosis not present

## 2017-10-12 DIAGNOSIS — E1140 Type 2 diabetes mellitus with diabetic neuropathy, unspecified: Secondary | ICD-10-CM | POA: Diagnosis not present

## 2017-10-12 DIAGNOSIS — I69398 Other sequelae of cerebral infarction: Secondary | ICD-10-CM | POA: Diagnosis not present

## 2017-10-12 DIAGNOSIS — M6281 Muscle weakness (generalized): Secondary | ICD-10-CM | POA: Diagnosis not present

## 2017-10-12 DIAGNOSIS — I129 Hypertensive chronic kidney disease with stage 1 through stage 4 chronic kidney disease, or unspecified chronic kidney disease: Secondary | ICD-10-CM | POA: Diagnosis not present

## 2017-10-12 DIAGNOSIS — M4856XD Collapsed vertebra, not elsewhere classified, lumbar region, subsequent encounter for fracture with routine healing: Secondary | ICD-10-CM | POA: Diagnosis not present

## 2017-10-12 DIAGNOSIS — E079 Disorder of thyroid, unspecified: Secondary | ICD-10-CM | POA: Diagnosis not present

## 2017-10-13 DIAGNOSIS — I129 Hypertensive chronic kidney disease with stage 1 through stage 4 chronic kidney disease, or unspecified chronic kidney disease: Secondary | ICD-10-CM | POA: Diagnosis not present

## 2017-10-13 DIAGNOSIS — M6281 Muscle weakness (generalized): Secondary | ICD-10-CM | POA: Diagnosis not present

## 2017-10-13 DIAGNOSIS — I69398 Other sequelae of cerebral infarction: Secondary | ICD-10-CM | POA: Diagnosis not present

## 2017-10-13 DIAGNOSIS — M4856XD Collapsed vertebra, not elsewhere classified, lumbar region, subsequent encounter for fracture with routine healing: Secondary | ICD-10-CM | POA: Diagnosis not present

## 2017-10-13 DIAGNOSIS — E079 Disorder of thyroid, unspecified: Secondary | ICD-10-CM | POA: Diagnosis not present

## 2017-10-13 DIAGNOSIS — E1140 Type 2 diabetes mellitus with diabetic neuropathy, unspecified: Secondary | ICD-10-CM | POA: Diagnosis not present

## 2017-10-14 DIAGNOSIS — I69398 Other sequelae of cerebral infarction: Secondary | ICD-10-CM | POA: Diagnosis not present

## 2017-10-14 DIAGNOSIS — I129 Hypertensive chronic kidney disease with stage 1 through stage 4 chronic kidney disease, or unspecified chronic kidney disease: Secondary | ICD-10-CM | POA: Diagnosis not present

## 2017-10-14 DIAGNOSIS — M6281 Muscle weakness (generalized): Secondary | ICD-10-CM | POA: Diagnosis not present

## 2017-10-14 DIAGNOSIS — M4856XD Collapsed vertebra, not elsewhere classified, lumbar region, subsequent encounter for fracture with routine healing: Secondary | ICD-10-CM | POA: Diagnosis not present

## 2017-10-14 DIAGNOSIS — E079 Disorder of thyroid, unspecified: Secondary | ICD-10-CM | POA: Diagnosis not present

## 2017-10-14 DIAGNOSIS — E1140 Type 2 diabetes mellitus with diabetic neuropathy, unspecified: Secondary | ICD-10-CM | POA: Diagnosis not present

## 2017-10-17 DIAGNOSIS — E079 Disorder of thyroid, unspecified: Secondary | ICD-10-CM | POA: Diagnosis not present

## 2017-10-17 DIAGNOSIS — M4856XD Collapsed vertebra, not elsewhere classified, lumbar region, subsequent encounter for fracture with routine healing: Secondary | ICD-10-CM | POA: Diagnosis not present

## 2017-10-17 DIAGNOSIS — I69398 Other sequelae of cerebral infarction: Secondary | ICD-10-CM | POA: Diagnosis not present

## 2017-10-17 DIAGNOSIS — I129 Hypertensive chronic kidney disease with stage 1 through stage 4 chronic kidney disease, or unspecified chronic kidney disease: Secondary | ICD-10-CM | POA: Diagnosis not present

## 2017-10-17 DIAGNOSIS — M6281 Muscle weakness (generalized): Secondary | ICD-10-CM | POA: Diagnosis not present

## 2017-10-17 DIAGNOSIS — E1140 Type 2 diabetes mellitus with diabetic neuropathy, unspecified: Secondary | ICD-10-CM | POA: Diagnosis not present

## 2017-10-18 DIAGNOSIS — I129 Hypertensive chronic kidney disease with stage 1 through stage 4 chronic kidney disease, or unspecified chronic kidney disease: Secondary | ICD-10-CM | POA: Diagnosis not present

## 2017-10-18 DIAGNOSIS — M4856XD Collapsed vertebra, not elsewhere classified, lumbar region, subsequent encounter for fracture with routine healing: Secondary | ICD-10-CM | POA: Diagnosis not present

## 2017-10-18 DIAGNOSIS — M6281 Muscle weakness (generalized): Secondary | ICD-10-CM | POA: Diagnosis not present

## 2017-10-18 DIAGNOSIS — E1140 Type 2 diabetes mellitus with diabetic neuropathy, unspecified: Secondary | ICD-10-CM | POA: Diagnosis not present

## 2017-10-18 DIAGNOSIS — E079 Disorder of thyroid, unspecified: Secondary | ICD-10-CM | POA: Diagnosis not present

## 2017-10-18 DIAGNOSIS — I69398 Other sequelae of cerebral infarction: Secondary | ICD-10-CM | POA: Diagnosis not present

## 2017-10-19 DIAGNOSIS — E1140 Type 2 diabetes mellitus with diabetic neuropathy, unspecified: Secondary | ICD-10-CM | POA: Diagnosis not present

## 2017-10-19 DIAGNOSIS — I129 Hypertensive chronic kidney disease with stage 1 through stage 4 chronic kidney disease, or unspecified chronic kidney disease: Secondary | ICD-10-CM | POA: Diagnosis not present

## 2017-10-19 DIAGNOSIS — E079 Disorder of thyroid, unspecified: Secondary | ICD-10-CM | POA: Diagnosis not present

## 2017-10-19 DIAGNOSIS — I69398 Other sequelae of cerebral infarction: Secondary | ICD-10-CM | POA: Diagnosis not present

## 2017-10-19 DIAGNOSIS — M6281 Muscle weakness (generalized): Secondary | ICD-10-CM | POA: Diagnosis not present

## 2017-10-19 DIAGNOSIS — M4856XD Collapsed vertebra, not elsewhere classified, lumbar region, subsequent encounter for fracture with routine healing: Secondary | ICD-10-CM | POA: Diagnosis not present

## 2017-10-21 DIAGNOSIS — E079 Disorder of thyroid, unspecified: Secondary | ICD-10-CM | POA: Diagnosis not present

## 2017-10-21 DIAGNOSIS — I69398 Other sequelae of cerebral infarction: Secondary | ICD-10-CM | POA: Diagnosis not present

## 2017-10-21 DIAGNOSIS — I129 Hypertensive chronic kidney disease with stage 1 through stage 4 chronic kidney disease, or unspecified chronic kidney disease: Secondary | ICD-10-CM | POA: Diagnosis not present

## 2017-10-21 DIAGNOSIS — E1140 Type 2 diabetes mellitus with diabetic neuropathy, unspecified: Secondary | ICD-10-CM | POA: Diagnosis not present

## 2017-10-21 DIAGNOSIS — M6281 Muscle weakness (generalized): Secondary | ICD-10-CM | POA: Diagnosis not present

## 2017-10-21 DIAGNOSIS — M4856XD Collapsed vertebra, not elsewhere classified, lumbar region, subsequent encounter for fracture with routine healing: Secondary | ICD-10-CM | POA: Diagnosis not present

## 2017-10-24 DIAGNOSIS — I129 Hypertensive chronic kidney disease with stage 1 through stage 4 chronic kidney disease, or unspecified chronic kidney disease: Secondary | ICD-10-CM | POA: Diagnosis not present

## 2017-10-24 DIAGNOSIS — E079 Disorder of thyroid, unspecified: Secondary | ICD-10-CM | POA: Diagnosis not present

## 2017-10-24 DIAGNOSIS — M4856XD Collapsed vertebra, not elsewhere classified, lumbar region, subsequent encounter for fracture with routine healing: Secondary | ICD-10-CM | POA: Diagnosis not present

## 2017-10-24 DIAGNOSIS — M6281 Muscle weakness (generalized): Secondary | ICD-10-CM | POA: Diagnosis not present

## 2017-10-24 DIAGNOSIS — E1140 Type 2 diabetes mellitus with diabetic neuropathy, unspecified: Secondary | ICD-10-CM | POA: Diagnosis not present

## 2017-10-24 DIAGNOSIS — I69398 Other sequelae of cerebral infarction: Secondary | ICD-10-CM | POA: Diagnosis not present

## 2017-10-25 DIAGNOSIS — M6281 Muscle weakness (generalized): Secondary | ICD-10-CM | POA: Diagnosis not present

## 2017-10-25 DIAGNOSIS — I129 Hypertensive chronic kidney disease with stage 1 through stage 4 chronic kidney disease, or unspecified chronic kidney disease: Secondary | ICD-10-CM | POA: Diagnosis not present

## 2017-10-25 DIAGNOSIS — E079 Disorder of thyroid, unspecified: Secondary | ICD-10-CM | POA: Diagnosis not present

## 2017-10-25 DIAGNOSIS — E1140 Type 2 diabetes mellitus with diabetic neuropathy, unspecified: Secondary | ICD-10-CM | POA: Diagnosis not present

## 2017-10-25 DIAGNOSIS — M4856XD Collapsed vertebra, not elsewhere classified, lumbar region, subsequent encounter for fracture with routine healing: Secondary | ICD-10-CM | POA: Diagnosis not present

## 2017-10-25 DIAGNOSIS — I69398 Other sequelae of cerebral infarction: Secondary | ICD-10-CM | POA: Diagnosis not present

## 2017-10-26 DIAGNOSIS — I69398 Other sequelae of cerebral infarction: Secondary | ICD-10-CM | POA: Diagnosis not present

## 2017-10-26 DIAGNOSIS — M4856XD Collapsed vertebra, not elsewhere classified, lumbar region, subsequent encounter for fracture with routine healing: Secondary | ICD-10-CM | POA: Diagnosis not present

## 2017-10-26 DIAGNOSIS — I129 Hypertensive chronic kidney disease with stage 1 through stage 4 chronic kidney disease, or unspecified chronic kidney disease: Secondary | ICD-10-CM | POA: Diagnosis not present

## 2017-10-26 DIAGNOSIS — E1140 Type 2 diabetes mellitus with diabetic neuropathy, unspecified: Secondary | ICD-10-CM | POA: Diagnosis not present

## 2017-10-26 DIAGNOSIS — E079 Disorder of thyroid, unspecified: Secondary | ICD-10-CM | POA: Diagnosis not present

## 2017-10-26 DIAGNOSIS — M6281 Muscle weakness (generalized): Secondary | ICD-10-CM | POA: Diagnosis not present

## 2017-10-28 DIAGNOSIS — M6281 Muscle weakness (generalized): Secondary | ICD-10-CM | POA: Diagnosis not present

## 2017-10-28 DIAGNOSIS — E079 Disorder of thyroid, unspecified: Secondary | ICD-10-CM | POA: Diagnosis not present

## 2017-10-28 DIAGNOSIS — I129 Hypertensive chronic kidney disease with stage 1 through stage 4 chronic kidney disease, or unspecified chronic kidney disease: Secondary | ICD-10-CM | POA: Diagnosis not present

## 2017-10-28 DIAGNOSIS — I69398 Other sequelae of cerebral infarction: Secondary | ICD-10-CM | POA: Diagnosis not present

## 2017-10-28 DIAGNOSIS — E1140 Type 2 diabetes mellitus with diabetic neuropathy, unspecified: Secondary | ICD-10-CM | POA: Diagnosis not present

## 2017-10-28 DIAGNOSIS — M4856XD Collapsed vertebra, not elsewhere classified, lumbar region, subsequent encounter for fracture with routine healing: Secondary | ICD-10-CM | POA: Diagnosis not present

## 2017-10-31 DIAGNOSIS — I639 Cerebral infarction, unspecified: Secondary | ICD-10-CM | POA: Diagnosis not present

## 2017-10-31 DIAGNOSIS — N184 Chronic kidney disease, stage 4 (severe): Secondary | ICD-10-CM | POA: Diagnosis not present

## 2017-10-31 DIAGNOSIS — R001 Bradycardia, unspecified: Secondary | ICD-10-CM | POA: Insufficient documentation

## 2017-10-31 DIAGNOSIS — I480 Paroxysmal atrial fibrillation: Secondary | ICD-10-CM | POA: Diagnosis not present

## 2017-10-31 DIAGNOSIS — I129 Hypertensive chronic kidney disease with stage 1 through stage 4 chronic kidney disease, or unspecified chronic kidney disease: Secondary | ICD-10-CM | POA: Diagnosis not present

## 2017-11-01 DIAGNOSIS — M6281 Muscle weakness (generalized): Secondary | ICD-10-CM | POA: Diagnosis not present

## 2017-11-01 DIAGNOSIS — I129 Hypertensive chronic kidney disease with stage 1 through stage 4 chronic kidney disease, or unspecified chronic kidney disease: Secondary | ICD-10-CM | POA: Diagnosis not present

## 2017-11-01 DIAGNOSIS — M4856XD Collapsed vertebra, not elsewhere classified, lumbar region, subsequent encounter for fracture with routine healing: Secondary | ICD-10-CM | POA: Diagnosis not present

## 2017-11-01 DIAGNOSIS — E1140 Type 2 diabetes mellitus with diabetic neuropathy, unspecified: Secondary | ICD-10-CM | POA: Diagnosis not present

## 2017-11-01 DIAGNOSIS — I69398 Other sequelae of cerebral infarction: Secondary | ICD-10-CM | POA: Diagnosis not present

## 2017-11-01 DIAGNOSIS — E079 Disorder of thyroid, unspecified: Secondary | ICD-10-CM | POA: Diagnosis not present

## 2017-11-02 DIAGNOSIS — M4856XD Collapsed vertebra, not elsewhere classified, lumbar region, subsequent encounter for fracture with routine healing: Secondary | ICD-10-CM | POA: Diagnosis not present

## 2017-11-02 DIAGNOSIS — E1140 Type 2 diabetes mellitus with diabetic neuropathy, unspecified: Secondary | ICD-10-CM | POA: Diagnosis not present

## 2017-11-02 DIAGNOSIS — E079 Disorder of thyroid, unspecified: Secondary | ICD-10-CM | POA: Diagnosis not present

## 2017-11-02 DIAGNOSIS — I129 Hypertensive chronic kidney disease with stage 1 through stage 4 chronic kidney disease, or unspecified chronic kidney disease: Secondary | ICD-10-CM | POA: Diagnosis not present

## 2017-11-02 DIAGNOSIS — M6281 Muscle weakness (generalized): Secondary | ICD-10-CM | POA: Diagnosis not present

## 2017-11-02 DIAGNOSIS — I69398 Other sequelae of cerebral infarction: Secondary | ICD-10-CM | POA: Diagnosis not present

## 2017-11-03 DIAGNOSIS — I129 Hypertensive chronic kidney disease with stage 1 through stage 4 chronic kidney disease, or unspecified chronic kidney disease: Secondary | ICD-10-CM | POA: Diagnosis not present

## 2017-11-03 DIAGNOSIS — M4856XD Collapsed vertebra, not elsewhere classified, lumbar region, subsequent encounter for fracture with routine healing: Secondary | ICD-10-CM | POA: Diagnosis not present

## 2017-11-03 DIAGNOSIS — E1140 Type 2 diabetes mellitus with diabetic neuropathy, unspecified: Secondary | ICD-10-CM | POA: Diagnosis not present

## 2017-11-03 DIAGNOSIS — E079 Disorder of thyroid, unspecified: Secondary | ICD-10-CM | POA: Diagnosis not present

## 2017-11-03 DIAGNOSIS — M6281 Muscle weakness (generalized): Secondary | ICD-10-CM | POA: Diagnosis not present

## 2017-11-03 DIAGNOSIS — I69398 Other sequelae of cerebral infarction: Secondary | ICD-10-CM | POA: Diagnosis not present

## 2017-11-04 DIAGNOSIS — E079 Disorder of thyroid, unspecified: Secondary | ICD-10-CM | POA: Diagnosis not present

## 2017-11-04 DIAGNOSIS — M6281 Muscle weakness (generalized): Secondary | ICD-10-CM | POA: Diagnosis not present

## 2017-11-04 DIAGNOSIS — I69398 Other sequelae of cerebral infarction: Secondary | ICD-10-CM | POA: Diagnosis not present

## 2017-11-04 DIAGNOSIS — M4856XD Collapsed vertebra, not elsewhere classified, lumbar region, subsequent encounter for fracture with routine healing: Secondary | ICD-10-CM | POA: Diagnosis not present

## 2017-11-04 DIAGNOSIS — E1140 Type 2 diabetes mellitus with diabetic neuropathy, unspecified: Secondary | ICD-10-CM | POA: Diagnosis not present

## 2017-11-04 DIAGNOSIS — I129 Hypertensive chronic kidney disease with stage 1 through stage 4 chronic kidney disease, or unspecified chronic kidney disease: Secondary | ICD-10-CM | POA: Diagnosis not present

## 2017-11-07 DIAGNOSIS — I129 Hypertensive chronic kidney disease with stage 1 through stage 4 chronic kidney disease, or unspecified chronic kidney disease: Secondary | ICD-10-CM | POA: Diagnosis not present

## 2017-11-07 DIAGNOSIS — I69398 Other sequelae of cerebral infarction: Secondary | ICD-10-CM | POA: Diagnosis not present

## 2017-11-07 DIAGNOSIS — M6281 Muscle weakness (generalized): Secondary | ICD-10-CM | POA: Diagnosis not present

## 2017-11-07 DIAGNOSIS — M4856XD Collapsed vertebra, not elsewhere classified, lumbar region, subsequent encounter for fracture with routine healing: Secondary | ICD-10-CM | POA: Diagnosis not present

## 2017-11-07 DIAGNOSIS — E079 Disorder of thyroid, unspecified: Secondary | ICD-10-CM | POA: Diagnosis not present

## 2017-11-07 DIAGNOSIS — E1140 Type 2 diabetes mellitus with diabetic neuropathy, unspecified: Secondary | ICD-10-CM | POA: Diagnosis not present

## 2017-11-08 DIAGNOSIS — E1140 Type 2 diabetes mellitus with diabetic neuropathy, unspecified: Secondary | ICD-10-CM | POA: Diagnosis not present

## 2017-11-08 DIAGNOSIS — I129 Hypertensive chronic kidney disease with stage 1 through stage 4 chronic kidney disease, or unspecified chronic kidney disease: Secondary | ICD-10-CM | POA: Diagnosis not present

## 2017-11-08 DIAGNOSIS — M6281 Muscle weakness (generalized): Secondary | ICD-10-CM | POA: Diagnosis not present

## 2017-11-08 DIAGNOSIS — E079 Disorder of thyroid, unspecified: Secondary | ICD-10-CM | POA: Diagnosis not present

## 2017-11-08 DIAGNOSIS — M4856XD Collapsed vertebra, not elsewhere classified, lumbar region, subsequent encounter for fracture with routine healing: Secondary | ICD-10-CM | POA: Diagnosis not present

## 2017-11-08 DIAGNOSIS — I69398 Other sequelae of cerebral infarction: Secondary | ICD-10-CM | POA: Diagnosis not present

## 2017-11-10 DIAGNOSIS — I491 Atrial premature depolarization: Secondary | ICD-10-CM | POA: Diagnosis not present

## 2017-11-10 DIAGNOSIS — M4856XD Collapsed vertebra, not elsewhere classified, lumbar region, subsequent encounter for fracture with routine healing: Secondary | ICD-10-CM | POA: Diagnosis not present

## 2017-11-10 DIAGNOSIS — I69398 Other sequelae of cerebral infarction: Secondary | ICD-10-CM | POA: Diagnosis not present

## 2017-11-10 DIAGNOSIS — E1140 Type 2 diabetes mellitus with diabetic neuropathy, unspecified: Secondary | ICD-10-CM | POA: Diagnosis not present

## 2017-11-10 DIAGNOSIS — I129 Hypertensive chronic kidney disease with stage 1 through stage 4 chronic kidney disease, or unspecified chronic kidney disease: Secondary | ICD-10-CM | POA: Diagnosis not present

## 2017-11-10 DIAGNOSIS — M6281 Muscle weakness (generalized): Secondary | ICD-10-CM | POA: Diagnosis not present

## 2017-11-10 DIAGNOSIS — E079 Disorder of thyroid, unspecified: Secondary | ICD-10-CM | POA: Diagnosis not present

## 2017-11-15 DIAGNOSIS — E079 Disorder of thyroid, unspecified: Secondary | ICD-10-CM | POA: Diagnosis not present

## 2017-11-15 DIAGNOSIS — I129 Hypertensive chronic kidney disease with stage 1 through stage 4 chronic kidney disease, or unspecified chronic kidney disease: Secondary | ICD-10-CM | POA: Diagnosis not present

## 2017-11-15 DIAGNOSIS — I69398 Other sequelae of cerebral infarction: Secondary | ICD-10-CM | POA: Diagnosis not present

## 2017-11-15 DIAGNOSIS — M6281 Muscle weakness (generalized): Secondary | ICD-10-CM | POA: Diagnosis not present

## 2017-11-15 DIAGNOSIS — E1140 Type 2 diabetes mellitus with diabetic neuropathy, unspecified: Secondary | ICD-10-CM | POA: Diagnosis not present

## 2017-11-15 DIAGNOSIS — M4856XD Collapsed vertebra, not elsewhere classified, lumbar region, subsequent encounter for fracture with routine healing: Secondary | ICD-10-CM | POA: Diagnosis not present

## 2017-11-16 DIAGNOSIS — M6281 Muscle weakness (generalized): Secondary | ICD-10-CM | POA: Diagnosis not present

## 2017-11-16 DIAGNOSIS — M4856XD Collapsed vertebra, not elsewhere classified, lumbar region, subsequent encounter for fracture with routine healing: Secondary | ICD-10-CM | POA: Diagnosis not present

## 2017-11-16 DIAGNOSIS — I129 Hypertensive chronic kidney disease with stage 1 through stage 4 chronic kidney disease, or unspecified chronic kidney disease: Secondary | ICD-10-CM | POA: Diagnosis not present

## 2017-11-16 DIAGNOSIS — E079 Disorder of thyroid, unspecified: Secondary | ICD-10-CM | POA: Diagnosis not present

## 2017-11-16 DIAGNOSIS — E1140 Type 2 diabetes mellitus with diabetic neuropathy, unspecified: Secondary | ICD-10-CM | POA: Diagnosis not present

## 2017-11-16 DIAGNOSIS — I69398 Other sequelae of cerebral infarction: Secondary | ICD-10-CM | POA: Diagnosis not present

## 2017-11-17 DIAGNOSIS — I129 Hypertensive chronic kidney disease with stage 1 through stage 4 chronic kidney disease, or unspecified chronic kidney disease: Secondary | ICD-10-CM | POA: Diagnosis not present

## 2017-11-17 DIAGNOSIS — E079 Disorder of thyroid, unspecified: Secondary | ICD-10-CM | POA: Diagnosis not present

## 2017-11-17 DIAGNOSIS — M6281 Muscle weakness (generalized): Secondary | ICD-10-CM | POA: Diagnosis not present

## 2017-11-17 DIAGNOSIS — E1140 Type 2 diabetes mellitus with diabetic neuropathy, unspecified: Secondary | ICD-10-CM | POA: Diagnosis not present

## 2017-11-17 DIAGNOSIS — M4856XD Collapsed vertebra, not elsewhere classified, lumbar region, subsequent encounter for fracture with routine healing: Secondary | ICD-10-CM | POA: Diagnosis not present

## 2017-11-17 DIAGNOSIS — I69398 Other sequelae of cerebral infarction: Secondary | ICD-10-CM | POA: Diagnosis not present

## 2017-11-22 DIAGNOSIS — I129 Hypertensive chronic kidney disease with stage 1 through stage 4 chronic kidney disease, or unspecified chronic kidney disease: Secondary | ICD-10-CM | POA: Diagnosis not present

## 2017-11-22 DIAGNOSIS — M4856XD Collapsed vertebra, not elsewhere classified, lumbar region, subsequent encounter for fracture with routine healing: Secondary | ICD-10-CM | POA: Diagnosis not present

## 2017-11-22 DIAGNOSIS — I69398 Other sequelae of cerebral infarction: Secondary | ICD-10-CM | POA: Diagnosis not present

## 2017-11-22 DIAGNOSIS — M6281 Muscle weakness (generalized): Secondary | ICD-10-CM | POA: Diagnosis not present

## 2017-11-22 DIAGNOSIS — E1140 Type 2 diabetes mellitus with diabetic neuropathy, unspecified: Secondary | ICD-10-CM | POA: Diagnosis not present

## 2017-11-22 DIAGNOSIS — E079 Disorder of thyroid, unspecified: Secondary | ICD-10-CM | POA: Diagnosis not present

## 2017-11-30 DIAGNOSIS — I480 Paroxysmal atrial fibrillation: Secondary | ICD-10-CM | POA: Diagnosis not present

## 2017-12-14 DIAGNOSIS — I480 Paroxysmal atrial fibrillation: Secondary | ICD-10-CM | POA: Diagnosis not present

## 2017-12-14 DIAGNOSIS — I639 Cerebral infarction, unspecified: Secondary | ICD-10-CM | POA: Diagnosis not present

## 2017-12-14 DIAGNOSIS — I129 Hypertensive chronic kidney disease with stage 1 through stage 4 chronic kidney disease, or unspecified chronic kidney disease: Secondary | ICD-10-CM | POA: Diagnosis not present

## 2017-12-14 DIAGNOSIS — R001 Bradycardia, unspecified: Secondary | ICD-10-CM | POA: Diagnosis not present

## 2017-12-14 DIAGNOSIS — N184 Chronic kidney disease, stage 4 (severe): Secondary | ICD-10-CM | POA: Diagnosis not present

## 2017-12-14 DIAGNOSIS — E7800 Pure hypercholesterolemia, unspecified: Secondary | ICD-10-CM | POA: Diagnosis not present

## 2017-12-28 DIAGNOSIS — I480 Paroxysmal atrial fibrillation: Secondary | ICD-10-CM | POA: Diagnosis not present

## 2018-01-25 DIAGNOSIS — I480 Paroxysmal atrial fibrillation: Secondary | ICD-10-CM | POA: Diagnosis not present

## 2018-03-03 DIAGNOSIS — I480 Paroxysmal atrial fibrillation: Secondary | ICD-10-CM | POA: Diagnosis not present

## 2018-04-24 DIAGNOSIS — R600 Localized edema: Secondary | ICD-10-CM | POA: Insufficient documentation

## 2019-11-05 ENCOUNTER — Inpatient Hospital Stay
Admission: EM | Admit: 2019-11-05 | Discharge: 2019-11-08 | DRG: 689 | Disposition: A | Discharge status: Skilled Nursing Facility | Payer: Medicare Other | Attending: Hospitalist | Admitting: Hospitalist

## 2019-11-05 ENCOUNTER — Encounter: Payer: Self-pay | Admitting: Emergency Medicine

## 2019-11-05 ENCOUNTER — Other Ambulatory Visit: Payer: Self-pay

## 2019-11-05 DIAGNOSIS — M109 Gout, unspecified: Secondary | ICD-10-CM | POA: Diagnosis present

## 2019-11-05 DIAGNOSIS — E1122 Type 2 diabetes mellitus with diabetic chronic kidney disease: Secondary | ICD-10-CM | POA: Diagnosis present

## 2019-11-05 DIAGNOSIS — G250 Essential tremor: Secondary | ICD-10-CM | POA: Diagnosis present

## 2019-11-05 DIAGNOSIS — I129 Hypertensive chronic kidney disease with stage 1 through stage 4 chronic kidney disease, or unspecified chronic kidney disease: Secondary | ICD-10-CM | POA: Diagnosis present

## 2019-11-05 DIAGNOSIS — I6930 Unspecified sequelae of cerebral infarction: Secondary | ICD-10-CM

## 2019-11-05 DIAGNOSIS — N3001 Acute cystitis with hematuria: Secondary | ICD-10-CM | POA: Diagnosis not present

## 2019-11-05 DIAGNOSIS — R531 Weakness: Secondary | ICD-10-CM

## 2019-11-05 DIAGNOSIS — Z8673 Personal history of transient ischemic attack (TIA), and cerebral infarction without residual deficits: Secondary | ICD-10-CM

## 2019-11-05 DIAGNOSIS — E876 Hypokalemia: Secondary | ICD-10-CM | POA: Diagnosis present

## 2019-11-05 DIAGNOSIS — N390 Urinary tract infection, site not specified: Secondary | ICD-10-CM

## 2019-11-05 DIAGNOSIS — I444 Left anterior fascicular block: Secondary | ICD-10-CM | POA: Diagnosis present

## 2019-11-05 DIAGNOSIS — Z993 Dependence on wheelchair: Secondary | ICD-10-CM

## 2019-11-05 DIAGNOSIS — N3000 Acute cystitis without hematuria: Secondary | ICD-10-CM

## 2019-11-05 DIAGNOSIS — Z8249 Family history of ischemic heart disease and other diseases of the circulatory system: Secondary | ICD-10-CM

## 2019-11-05 DIAGNOSIS — I4891 Unspecified atrial fibrillation: Secondary | ICD-10-CM

## 2019-11-05 DIAGNOSIS — I482 Chronic atrial fibrillation, unspecified: Secondary | ICD-10-CM | POA: Diagnosis present

## 2019-11-05 DIAGNOSIS — E860 Dehydration: Secondary | ICD-10-CM | POA: Diagnosis present

## 2019-11-05 DIAGNOSIS — Z7901 Long term (current) use of anticoagulants: Secondary | ICD-10-CM

## 2019-11-05 DIAGNOSIS — Z90710 Acquired absence of both cervix and uterus: Secondary | ICD-10-CM

## 2019-11-05 DIAGNOSIS — B9620 Unspecified Escherichia coli [E. coli] as the cause of diseases classified elsewhere: Secondary | ICD-10-CM | POA: Diagnosis present

## 2019-11-05 DIAGNOSIS — Z79899 Other long term (current) drug therapy: Secondary | ICD-10-CM

## 2019-11-05 DIAGNOSIS — Z9181 History of falling: Secondary | ICD-10-CM

## 2019-11-05 DIAGNOSIS — J9811 Atelectasis: Secondary | ICD-10-CM | POA: Diagnosis present

## 2019-11-05 DIAGNOSIS — I10 Essential (primary) hypertension: Secondary | ICD-10-CM | POA: Diagnosis present

## 2019-11-05 DIAGNOSIS — N183 Chronic kidney disease, stage 3 unspecified: Secondary | ICD-10-CM

## 2019-11-05 DIAGNOSIS — G9341 Metabolic encephalopathy: Secondary | ICD-10-CM | POA: Diagnosis present

## 2019-11-05 DIAGNOSIS — Z9049 Acquired absence of other specified parts of digestive tract: Secondary | ICD-10-CM

## 2019-11-05 DIAGNOSIS — Z823 Family history of stroke: Secondary | ICD-10-CM

## 2019-11-05 DIAGNOSIS — N1832 Chronic kidney disease, stage 3b: Secondary | ICD-10-CM | POA: Diagnosis present

## 2019-11-05 DIAGNOSIS — E785 Hyperlipidemia, unspecified: Secondary | ICD-10-CM | POA: Diagnosis present

## 2019-11-05 DIAGNOSIS — E1142 Type 2 diabetes mellitus with diabetic polyneuropathy: Secondary | ICD-10-CM | POA: Diagnosis present

## 2019-11-05 DIAGNOSIS — Z20822 Contact with and (suspected) exposure to covid-19: Secondary | ICD-10-CM | POA: Diagnosis present

## 2019-11-05 LAB — CBC
HCT: 36.8 % (ref 36.0–46.0)
Hemoglobin: 12.8 g/dL (ref 12.0–15.0)
MCH: 33.3 pg (ref 26.0–34.0)
MCHC: 34.8 g/dL (ref 30.0–36.0)
MCV: 95.8 fL (ref 80.0–100.0)
Platelets: 212 10*3/uL (ref 150–400)
RBC: 3.84 MIL/uL — ABNORMAL LOW (ref 3.87–5.11)
RDW: 15.2 % (ref 11.5–15.5)
WBC: 7.7 10*3/uL (ref 4.0–10.5)
nRBC: 0 % (ref 0.0–0.2)

## 2019-11-05 LAB — BASIC METABOLIC PANEL
Anion gap: 7 (ref 5–15)
BUN: 31 mg/dL — ABNORMAL HIGH (ref 8–23)
CO2: 25 mmol/L (ref 22–32)
Calcium: 9.1 mg/dL (ref 8.9–10.3)
Chloride: 104 mmol/L (ref 98–111)
Creatinine, Ser: 1.39 mg/dL — ABNORMAL HIGH (ref 0.44–1.00)
GFR calc Af Amer: 40 mL/min — ABNORMAL LOW (ref >60)
GFR calc non Af Amer: 34 mL/min — ABNORMAL LOW (ref >60)
Glucose, Bld: 115 mg/dL — ABNORMAL HIGH (ref 70–99)
Potassium: 3.2 mmol/L — ABNORMAL LOW (ref 3.5–5.1)
Sodium: 136 mmol/L (ref 135–145)

## 2019-11-05 MED ORDER — METOPROLOL TARTRATE 50 MG PO TABS
50.0000 mg | ORAL_TABLET | Freq: Once | ORAL | Status: AC
  Administered 2019-11-06: 50 mg via ORAL
  Filled 2019-11-05: qty 1

## 2019-11-05 MED ORDER — POTASSIUM CHLORIDE CRYS ER 20 MEQ PO TBCR
40.0000 meq | EXTENDED_RELEASE_TABLET | Freq: Once | ORAL | Status: AC
  Administered 2019-11-06: 40 meq via ORAL
  Filled 2019-11-05: qty 2

## 2019-11-05 MED ORDER — METOPROLOL TARTRATE 5 MG/5ML IV SOLN
5.0000 mg | INTRAVENOUS | Status: DC | PRN
  Administered 2019-11-06: 5 mg via INTRAVENOUS
  Filled 2019-11-05: qty 5

## 2019-11-05 NOTE — ED Provider Notes (Signed)
[facility] Emergency Department Provider Note   ____________________________________________   First MD Initiated Contact with Patient 11/05/19 2332     (approximate)  I have reviewed the triage vital signs and the nursing notes.   HISTORY  Chief Complaint Weakness    HPI Erica Warren is a 86 y.o. female with past medical history of hypertension, hyperlipidemia, paroxysmal A. fib on Coumadin, gout, diabetes, CKD, and stroke who presents to the ED complaining of generalized weakness.  Patient reports that she has had 1 week of sinus pressure associated with nasal drainage, denies any fevers, cough, chest pain, or shortness of breath.  Over the past 3 days she has felt increasingly weak, typically gets around using a wheelchair but has been unable to get herself up out of the chair today.  She denies any palpitations, states she usually can tell when she is in atrial fibrillation.  She has not missed any doses of her Coumadin and reports taking her usual metoprolol this morning.  She has not had any abdominal pain, nausea, vomiting, dysuria, or hematuria.        Past Medical History:  Diagnosis Date  . Arthritis   . Atrial fibrillation (HCC)    unspecified  . Carotid stenosis    nonhemodynamic  . Carpal tunnel syndrome on right   . Chronic kidney disease   . Diabetes mellitus type 2, uncomplicated (HCC)   . Essential tremor   . Glaucoma   . Gout   . Gout   . Hyperlipidemia   . Hypertension   . Obesity    unspecified  . Stroke [facility])     Patient Active Problem List   Diagnosis Date Noted  . Weakness 11/06/2019  . Atrial fibrillation with RVR (HCC) 09/20/2017  . Essential hypertension, benign 09/20/2017  . Chronic gout of multiple sites 09/20/2017  . Dyslipidemia 09/20/2017  . Chronic constipation 09/20/2017  . Chronic ischemic left MCA stroke 09/20/2017    Past Surgical History:  Procedure Laterality Date  . ABDOMINAL HYSTERECTOMY   1982  . CATARACT EXTRACTION EXTRACAPSULAR Right 2006  . CATARACT EXTRACTION EXTRACAPSULAR Left 2004  . CHOLECYSTECTOMY  1986    Prior to Admission medications   Medication Sig Start Date End Date Taking? Authorizing Provider  allopurinol (ZYLOPRIM) 100 MG tablet Take 100 mg by mouth 2 (two) times daily.   Yes [provider]  Amino Acids-Protein Hydrolys (FEEDING SUPPLEMENT, PRO-STAT SUGAR FREE 64,) LIQD Take 30 mLs by mouth 2 (two) times daily.   Yes [provider]  amLODipine (NORVASC) 2.5 MG tablet Take 2.5 mg by mouth daily.   Yes [provider]  atorvastatin (LIPITOR) 40 MG tablet Take 40 mg by mouth daily.   Yes [provider]  docusate sodium (COLACE) 100 MG capsule Take 100 mg by mouth daily.   Yes [provider]  gabapentin (NEURONTIN) 100 MG capsule Take 100 mg by mouth 3 (three) times daily.   Yes [provider]  losartan-hydrochlorothiazide (HYZAAR) 100-25 MG tablet Take 1 tablet by mouth daily.   Yes [provider]  Nutritional Supplements (ENSURE ENLIVE PO) Take 1 Bottle by mouth 2 (two) times daily.   Yes [provider]  polyethylene glycol (MIRALAX / GLYCOLAX) packet Take 17 g by mouth daily.   Yes [provider]  warfarin (COUMADIN) 5 MG tablet Take 5 mg by mouth daily at 4 PM.  10/25/19  Yes [provider]  acetaminophen (TYLENOL) 325 MG tablet Take  650 mg by mouth every 4 (four) hours as needed. for pain/ increased temp. May be administered orally, per G-tube if needed or rectally if unable to swallow (separate order). Maximum dose for 24 hours is 3,000 mg from all sources of Acetaminophen/ Tylenol    [provider]  lidocaine (LIDODERM) 5 % Place 1 patch onto the skin daily. Remove & Discard patch within 12 hours or as directed by MD Patient not taking: Reported on 11/06/2019    [provider]  Metoprolol Tartrate 75 MG TABS Take 1 tablet by mouth 2 (two) times  daily. Hold for systolic BP less than 110, or pulse less than 60    [provider]    Allergies Patient has no known allergies.  Family History  Problem Relation Age of Onset  . Heart failure Mother   . Hypertension Mother   . Stroke Father   . Hypertension Brother     Social History Social History   Tobacco Use  . Smoking status: Never Smoker  . Smokeless tobacco: Never Used  Vaping Use  . Vaping Use: Never used  Substance Use Topics  . Alcohol use: Not Currently  . Drug use: Never    Review of Systems  Constitutional: No fever/chills.  Positive for generalized weakness. Eyes: No visual changes. ENT: No sore throat.  Positive for sinus pressure and drainage. Cardiovascular: Denies chest pain. Respiratory: Denies shortness of breath. Gastrointestinal: No abdominal pain.  No nausea, no vomiting.  No diarrhea.  No constipation. Genitourinary: Negative for dysuria. Musculoskeletal: Negative for back pain. Skin: Negative for rash. Neurological: Negative for headaches, focal weakness or numbness.  ____________________________________________   PHYSICAL EXAM:  VITAL SIGNS: ED Triage Vitals  Enc Vitals Group     BP 11/05/19 1315 113/65     Pulse Rate 11/05/19 1315 (!) 108     Resp 11/05/19 1315 16     Temp 11/05/19 1315 98.3 F (36.8 C)     Temp Source 11/05/19 1315 Oral     SpO2 11/05/19 1315 98 %     Weight 11/05/19 1316 230 lb (104.3 kg)     Height 11/05/19 1316 5\' 5"  (1.651 m)     Head Circumference --      Peak Flow --      Pain Score 11/05/19 1315 0     Pain Loc --      Pain Edu? --      Excl. in GC? --     Constitutional: Alert and oriented. Eyes: Conjunctivae are normal. Head: Atraumatic. Nose: No congestion/rhinnorhea. Mouth/Throat: Mucous membranes are moist. Neck: Normal ROM Cardiovascular: Tachycardic, irregularly irregular rhythm. Grossly normal heart sounds. Respiratory: Normal respiratory effort.  No retractions. Lungs  CTAB. Gastrointestinal: Soft and nontender. No distention. Genitourinary: deferred Musculoskeletal: No lower extremity tenderness nor edema. Neurologic:  Normal speech and language.  Generally weak with no gross focal neurologic deficits are appreciated. Skin:  Skin is warm, dry and intact. No rash noted. Psychiatric: Mood and affect are normal. Speech and behavior are normal.  ____________________________________________   LABS (all labs ordered are listed, but only abnormal results are displayed)  Labs Reviewed  BASIC METABOLIC PANEL - Abnormal; Notable for the following components:      Result Value   Potassium 3.2 (*)    Glucose, Bld 115 (*)    BUN 31 (*)    Creatinine, Ser 1.39 (*)    GFR calc non Af Amer 34 (*)    GFR calc Af 11/07/19  40 (*)    All other components within normal limits  CBC - Abnormal; Notable for the following components:   RBC 3.84 (*)    All other components within normal limits  URINALYSIS, COMPLETE (UACMP) WITH MICROSCOPIC - Abnormal; Notable for the following components:   Color, Urine YELLOW (*)    APPearance CLOUDY (*)    Hgb urine dipstick MODERATE (*)    Leukocytes,Ua TRACE (*)    All other components within normal limits  SARS CORONAVIRUS 2 BY RT PCR (HOSPITAL ORDER, PERFORMED IN [facility])  URINE CULTURE  MAGNESIUM  PROTIME-INR  BASIC METABOLIC PANEL  CBC  CBG MONITORING, ED  TROPONIN I (HIGH SENSITIVITY)  TROPONIN I (HIGH SENSITIVITY)   ____________________________________________  EKG  ED ECG REPORT I, Chesley Noonharles Mann Skaggs, the attending physician, personally viewed and interpreted this ECG.   Date: 11/05/2019  EKG Time: 13:31  Rate: 122  Rhythm: atrial fibrillation, rate 122  Axis: Normal  Intervals:right bundle branch block  ST&T Change: None   PROCEDURES  Procedure(s) performed (including Critical Care):  .Critical Care Performed by: Chesley NoonJessup, Macguire Holsinger, MD Authorized by: Chesley NoonJessup, Julio Storr, MD   Critical care  provider statement:    Critical care time (minutes):  45   Critical care time was exclusive of:  Separately billable procedures and treating other patients and teaching time   Critical care was necessary to treat or prevent imminent or life-threatening deterioration of the following conditions:  Cardiac failure   Critical care was time spent personally by me on the following activities:  Discussions with consultants, evaluation of patient's response to treatment, examination of patient, ordering and performing treatments and interventions, ordering and review of laboratory studies, ordering and review of radiographic studies, pulse oximetry, re-evaluation of patient's condition, obtaining history from patient or surrogate and review of old charts   I assumed direction of critical care for this patient from another provider in my specialty: no       ____________________________________________   INITIAL IMPRESSION / ASSESSMENT AND PLAN / ED COURSE       86 year old female with past medical history of hypertension, hyperlipidemia, paroxysmal A. fib on Coumadin, gout, diabetes, CKD, and stroke who presents to the ED complaining of 1 week of nasal pressure and drainage now with increasing generalized weakness and difficulty getting herself up out of her wheelchair.  EKG shows atrial fibrillation with rapid ventricular response but no acute ischemic changes.  Patient continues to be in A. fib RVR on initial evaluation and we will attempt to control rate with IV metoprolol as well as her usual evening dose of p.o. metoprolol.  Lab work thus far is unremarkable, shows patient's baseline CKD.  We will add on troponin, magnesium, and INR.  Recurrence of A. fib could be related to sinus infection, we will also screen UA and chest x-ray for other infectious process.  Patient's heart rate is improved and now consistently below 100 following one-time IV dose of metoprolol as well as her evening p.o. dose.   Troponin and magnesium are within normal limits, however UA is concerning for UTI.  We will treat with Rocephin.  Patient has had significant difficulty caring for herself at home due to her level of weakness, because of this case was discussed with hospitalist for admission.      ____________________________________________   FINAL CLINICAL IMPRESSION(S) / ED DIAGNOSES  Final diagnoses:  Generalized weakness  Acute cystitis without hematuria  Atrial fibrillation with RVR [facility](HCC)     ED  Discharge Orders    None       Note:  This document was prepared using Dragon voice recognition software and may include unintentional dictation errors.   Chesley Noon, MD 11/06/19 [phone]

## 2019-11-05 NOTE — ED Triage Notes (Signed)
Pt in via [facility] from home, reports worsening weakness over the last 3 days, being unable to get out of chair today.  A/Ox4, NAD noted at this time.

## 2019-11-05 NOTE — ED Notes (Signed)
ED MD at the bedside for pt evaluatin

## 2019-11-05 NOTE — ED Triage Notes (Signed)
Pt in via EMS from home with c/o generalized weakness for 3 days. Hx of A-fib, 142/81, HR 108, FSBS 119.

## 2019-11-06 ENCOUNTER — Emergency Department: Payer: Medicare Other

## 2019-11-06 DIAGNOSIS — N390 Urinary tract infection, site not specified: Secondary | ICD-10-CM

## 2019-11-06 DIAGNOSIS — M109 Gout, unspecified: Secondary | ICD-10-CM | POA: Diagnosis present

## 2019-11-06 DIAGNOSIS — Z993 Dependence on wheelchair: Secondary | ICD-10-CM | POA: Diagnosis not present

## 2019-11-06 DIAGNOSIS — N1832 Chronic kidney disease, stage 3b: Secondary | ICD-10-CM | POA: Diagnosis present

## 2019-11-06 DIAGNOSIS — I444 Left anterior fascicular block: Secondary | ICD-10-CM | POA: Diagnosis present

## 2019-11-06 DIAGNOSIS — Z9181 History of falling: Secondary | ICD-10-CM | POA: Diagnosis not present

## 2019-11-06 DIAGNOSIS — G250 Essential tremor: Secondary | ICD-10-CM | POA: Diagnosis present

## 2019-11-06 DIAGNOSIS — Z8249 Family history of ischemic heart disease and other diseases of the circulatory system: Secondary | ICD-10-CM | POA: Diagnosis not present

## 2019-11-06 DIAGNOSIS — B9620 Unspecified Escherichia coli [E. coli] as the cause of diseases classified elsewhere: Secondary | ICD-10-CM | POA: Diagnosis present

## 2019-11-06 DIAGNOSIS — I10 Essential (primary) hypertension: Secondary | ICD-10-CM

## 2019-11-06 DIAGNOSIS — Z79899 Other long term (current) drug therapy: Secondary | ICD-10-CM | POA: Diagnosis not present

## 2019-11-06 DIAGNOSIS — E860 Dehydration: Secondary | ICD-10-CM | POA: Diagnosis present

## 2019-11-06 DIAGNOSIS — I4891 Unspecified atrial fibrillation: Secondary | ICD-10-CM | POA: Diagnosis not present

## 2019-11-06 DIAGNOSIS — N3000 Acute cystitis without hematuria: Secondary | ICD-10-CM | POA: Diagnosis not present

## 2019-11-06 DIAGNOSIS — G9341 Metabolic encephalopathy: Secondary | ICD-10-CM | POA: Diagnosis present

## 2019-11-06 DIAGNOSIS — E785 Hyperlipidemia, unspecified: Secondary | ICD-10-CM | POA: Diagnosis present

## 2019-11-06 DIAGNOSIS — J9811 Atelectasis: Secondary | ICD-10-CM | POA: Diagnosis present

## 2019-11-06 DIAGNOSIS — E1122 Type 2 diabetes mellitus with diabetic chronic kidney disease: Secondary | ICD-10-CM | POA: Diagnosis present

## 2019-11-06 DIAGNOSIS — Z90710 Acquired absence of both cervix and uterus: Secondary | ICD-10-CM | POA: Diagnosis not present

## 2019-11-06 DIAGNOSIS — I482 Chronic atrial fibrillation, unspecified: Secondary | ICD-10-CM | POA: Diagnosis present

## 2019-11-06 DIAGNOSIS — R531 Weakness: Secondary | ICD-10-CM | POA: Diagnosis present

## 2019-11-06 DIAGNOSIS — I6930 Unspecified sequelae of cerebral infarction: Secondary | ICD-10-CM

## 2019-11-06 DIAGNOSIS — Z7901 Long term (current) use of anticoagulants: Secondary | ICD-10-CM | POA: Diagnosis not present

## 2019-11-06 DIAGNOSIS — Z20822 Contact with and (suspected) exposure to covid-19: Secondary | ICD-10-CM | POA: Diagnosis present

## 2019-11-06 DIAGNOSIS — E1142 Type 2 diabetes mellitus with diabetic polyneuropathy: Secondary | ICD-10-CM | POA: Diagnosis present

## 2019-11-06 DIAGNOSIS — N183 Chronic kidney disease, stage 3 unspecified: Secondary | ICD-10-CM

## 2019-11-06 DIAGNOSIS — Z9049 Acquired absence of other specified parts of digestive tract: Secondary | ICD-10-CM | POA: Diagnosis not present

## 2019-11-06 DIAGNOSIS — N3001 Acute cystitis with hematuria: Secondary | ICD-10-CM | POA: Diagnosis present

## 2019-11-06 DIAGNOSIS — E876 Hypokalemia: Secondary | ICD-10-CM | POA: Diagnosis present

## 2019-11-06 DIAGNOSIS — I129 Hypertensive chronic kidney disease with stage 1 through stage 4 chronic kidney disease, or unspecified chronic kidney disease: Secondary | ICD-10-CM | POA: Diagnosis present

## 2019-11-06 DIAGNOSIS — Z8673 Personal history of transient ischemic attack (TIA), and cerebral infarction without residual deficits: Secondary | ICD-10-CM | POA: Diagnosis not present

## 2019-11-06 LAB — URINALYSIS, COMPLETE (UACMP) WITH MICROSCOPIC
Bacteria, UA: NONE SEEN
Bilirubin Urine: NEGATIVE
Glucose, UA: NEGATIVE mg/dL
Ketones, ur: NEGATIVE mg/dL
Nitrite: NEGATIVE
Protein, ur: NEGATIVE mg/dL
Specific Gravity, Urine: 1.018 (ref 1.005–1.030)
pH: 5 (ref 5.0–8.0)

## 2019-11-06 LAB — BASIC METABOLIC PANEL
Anion gap: 12 (ref 5–15)
BUN: 27 mg/dL — ABNORMAL HIGH (ref 8–23)
CO2: 25 mmol/L (ref 22–32)
Calcium: 8.8 mg/dL — ABNORMAL LOW (ref 8.9–10.3)
Chloride: 101 mmol/L (ref 98–111)
Creatinine, Ser: 1.14 mg/dL — ABNORMAL HIGH (ref 0.44–1.00)
GFR calc Af Amer: 50 mL/min — ABNORMAL LOW (ref >60)
GFR calc non Af Amer: 44 mL/min — ABNORMAL LOW (ref >60)
Glucose, Bld: 145 mg/dL — ABNORMAL HIGH (ref 70–99)
Potassium: 3.6 mmol/L (ref 3.5–5.1)
Sodium: 138 mmol/L (ref 135–145)

## 2019-11-06 LAB — CBC
HCT: 35.7 % — ABNORMAL LOW (ref 36.0–46.0)
Hemoglobin: 12.4 g/dL (ref 12.0–15.0)
MCH: 33.4 pg (ref 26.0–34.0)
MCHC: 34.7 g/dL (ref 30.0–36.0)
MCV: 96.2 fL (ref 80.0–100.0)
Platelets: 196 10*3/uL (ref 150–400)
RBC: 3.71 MIL/uL — ABNORMAL LOW (ref 3.87–5.11)
RDW: 15.2 % (ref 11.5–15.5)
WBC: 8.9 10*3/uL (ref 4.0–10.5)
nRBC: 0 % (ref 0.0–0.2)

## 2019-11-06 LAB — TROPONIN I (HIGH SENSITIVITY)
Troponin I (High Sensitivity): 12 ng/L (ref <18)
Troponin I (High Sensitivity): 15 ng/L (ref <18)

## 2019-11-06 LAB — MAGNESIUM: Magnesium: 1.7 mg/dL (ref 1.7–2.4)

## 2019-11-06 LAB — PROTIME-INR
INR: 2.8 — ABNORMAL HIGH (ref 0.8–1.2)
Prothrombin Time: 28.6 seconds — ABNORMAL HIGH (ref 11.4–15.2)

## 2019-11-06 LAB — SARS CORONAVIRUS 2 BY RT PCR (HOSPITAL ORDER, PERFORMED IN ~~LOC~~ HOSPITAL LAB): SARS Coronavirus 2: NEGATIVE

## 2019-11-06 MED ORDER — DOCUSATE SODIUM 100 MG PO CAPS
100.0000 mg | ORAL_CAPSULE | Freq: Every day | ORAL | Status: DC
  Administered 2019-11-06 – 2019-11-08 (×3): 100 mg via ORAL
  Filled 2019-11-06 (×3): qty 1

## 2019-11-06 MED ORDER — DEXTROSE IN LACTATED RINGERS 5 % IV SOLN: INTRAVENOUS | Status: DC

## 2019-11-06 MED ORDER — ATORVASTATIN CALCIUM 20 MG PO TABS
40.0000 mg | ORAL_TABLET | Freq: Every evening | ORAL | Status: DC
  Administered 2019-11-06 – 2019-11-07 (×2): 40 mg via ORAL
  Filled 2019-11-06: qty 2

## 2019-11-06 MED ORDER — WARFARIN - PHARMACIST DOSING INPATIENT
Freq: Every day | Status: DC
  Filled 2019-11-06: qty 1

## 2019-11-06 MED ORDER — ONDANSETRON HCL 4 MG/2ML IJ SOLN: 4.0000 mg | Freq: Four times a day (QID) | INTRAMUSCULAR | Status: DC | PRN

## 2019-11-06 MED ORDER — ACETAMINOPHEN 650 MG RE SUPP: 650.0000 mg | Freq: Four times a day (QID) | RECTAL | Status: DC | PRN

## 2019-11-06 MED ORDER — PRO-STAT SUGAR FREE PO LIQD
30.0000 mL | Freq: Two times a day (BID) | ORAL | Status: DC
  Administered 2019-11-07: 30 mL via ORAL

## 2019-11-06 MED ORDER — SODIUM CHLORIDE 0.9 % IV SOLN
2.0000 g | INTRAVENOUS | Status: DC
  Filled 2019-11-06: qty 20

## 2019-11-06 MED ORDER — ENOXAPARIN SODIUM 40 MG/0.4ML ~~LOC~~ SOLN
40.0000 mg | SUBCUTANEOUS | Status: DC
  Administered 2019-11-06: 40 mg via SUBCUTANEOUS
  Filled 2019-11-06: qty 0.4

## 2019-11-06 MED ORDER — GABAPENTIN 100 MG PO CAPS
100.0000 mg | ORAL_CAPSULE | Freq: Three times a day (TID) | ORAL | Status: DC
  Administered 2019-11-06 – 2019-11-08 (×7): 100 mg via ORAL
  Filled 2019-11-06 (×9): qty 1

## 2019-11-06 MED ORDER — SODIUM CHLORIDE 0.9 % IV SOLN: INTRAVENOUS | Status: DC

## 2019-11-06 MED ORDER — TRAZODONE HCL 50 MG PO TABS: 25.0000 mg | ORAL_TABLET | Freq: Every evening | ORAL | Status: DC | PRN

## 2019-11-06 MED ORDER — HYDROCHLOROTHIAZIDE 25 MG PO TABS
25.0000 mg | ORAL_TABLET | Freq: Every day | ORAL | Status: DC
  Administered 2019-11-06: 25 mg via ORAL
  Filled 2019-11-06: qty 1

## 2019-11-06 MED ORDER — METOPROLOL TARTRATE 50 MG PO TABS
75.0000 mg | ORAL_TABLET | Freq: Two times a day (BID) | ORAL | Status: DC
  Administered 2019-11-06 – 2019-11-08 (×5): 75 mg via ORAL
  Filled 2019-11-06 (×5): qty 2

## 2019-11-06 MED ORDER — MAGNESIUM SULFATE 2 GM/50ML IV SOLN
2.0000 g | Freq: Once | INTRAVENOUS | Status: AC
  Administered 2019-11-06: 2 g via INTRAVENOUS
  Filled 2019-11-06: qty 50

## 2019-11-06 MED ORDER — SODIUM CHLORIDE 0.9 % IV SOLN
2.0000 g | INTRAVENOUS | Status: DC
  Administered 2019-11-06: 2 g via INTRAVENOUS
  Filled 2019-11-06: qty 20

## 2019-11-06 MED ORDER — SODIUM CHLORIDE 0.9 % IV SOLN
1.0000 g | Freq: Once | INTRAVENOUS | Status: AC
  Administered 2019-11-06: 1 g via INTRAVENOUS
  Filled 2019-11-06: qty 10

## 2019-11-06 MED ORDER — POTASSIUM CHLORIDE 20 MEQ PO PACK
40.0000 meq | PACK | Freq: Once | ORAL | Status: AC
  Administered 2019-11-06: 40 meq via ORAL
  Filled 2019-11-06: qty 2

## 2019-11-06 MED ORDER — LOSARTAN POTASSIUM-HCTZ 100-25 MG PO TABS
1.0000 | ORAL_TABLET | Freq: Every day | ORAL | Status: DC
Start: 2019-11-06 — End: 2019-11-06

## 2019-11-06 MED ORDER — ALLOPURINOL 100 MG PO TABS
100.0000 mg | ORAL_TABLET | Freq: Two times a day (BID) | ORAL | Status: DC
  Administered 2019-11-06 – 2019-11-08 (×5): 100 mg via ORAL
  Filled 2019-11-06 (×8): qty 1

## 2019-11-06 MED ORDER — AMLODIPINE BESYLATE 5 MG PO TABS
2.5000 mg | ORAL_TABLET | Freq: Every day | ORAL | Status: DC
  Administered 2019-11-06: 2.5 mg via ORAL
  Filled 2019-11-06: qty 1

## 2019-11-06 MED ORDER — ACETAMINOPHEN 325 MG PO TABS: 650.0000 mg | ORAL_TABLET | Freq: Four times a day (QID) | ORAL | Status: DC | PRN

## 2019-11-06 MED ORDER — ENSURE ENLIVE PO LIQD
237.0000 mL | Freq: Two times a day (BID) | ORAL | Status: DC
  Administered 2019-11-07: 237 mL via ORAL

## 2019-11-06 MED ORDER — MAGNESIUM HYDROXIDE 400 MG/5ML PO SUSP: 30.0000 mL | Freq: Every day | ORAL | Status: DC | PRN

## 2019-11-06 MED ORDER — LOSARTAN POTASSIUM 50 MG PO TABS
100.0000 mg | ORAL_TABLET | Freq: Every day | ORAL | Status: DC
  Administered 2019-11-06: 100 mg via ORAL
  Filled 2019-11-06: qty 2

## 2019-11-06 MED ORDER — LIDOCAINE 5 % EX PTCH
1.0000 | MEDICATED_PATCH | CUTANEOUS | Status: DC
  Administered 2019-11-06 – 2019-11-08 (×3): 1 via TRANSDERMAL
  Filled 2019-11-06 (×3): qty 1

## 2019-11-06 MED ORDER — ONDANSETRON HCL 4 MG PO TABS
4.0000 mg | ORAL_TABLET | Freq: Four times a day (QID) | ORAL | Status: DC | PRN
  Filled 2019-11-06: qty 1

## 2019-11-06 MED ORDER — WARFARIN SODIUM 2.5 MG PO TABS
2.5000 mg | ORAL_TABLET | Freq: Once | ORAL | Status: AC
  Administered 2019-11-06: 2.5 mg via ORAL
  Filled 2019-11-06: qty 1

## 2019-11-06 MED ORDER — POLYETHYLENE GLYCOL 3350 17 G PO PACK
17.0000 g | PACK | Freq: Every day | ORAL | Status: DC
  Administered 2019-11-06 – 2019-11-08 (×3): 17 g via ORAL
  Filled 2019-11-06 (×3): qty 1

## 2019-11-06 MED ORDER — ATORVASTATIN CALCIUM 20 MG PO TABS
40.0000 mg | ORAL_TABLET | Freq: Every day | ORAL | Status: DC
  Filled 2019-11-06: qty 2

## 2019-11-06 NOTE — ED Notes (Signed)
ED MD at the bedside to discuss plan of care

## 2019-11-06 NOTE — ED Notes (Signed)
Pt given applesauce and graham crackers. Sitting up talkative with no distress noted.

## 2019-11-06 NOTE — ED Notes (Signed)
Patient pulled up in bed/situated by this RN and Educational psychologist. [place] in place

## 2019-11-06 NOTE — Progress Notes (Signed)
PROGRESS NOTE    Erica Warren  MWU:[id number] DOB: 11-16-1930 DOA: 11/05/2019 PCP: Lauro Regulus, MD    Brief Narrative:  Patient admitted to the hospital with the working diagnosis of acute kidney injury in the setting of urinary tract infection.   86 year old female past medical history of type 2 diabetes mellitus, hypertension, dyslipidemia, CKD stage 3b, gout and atrial fibrillation. She presented with generalized weakness for the last 3 to 4 days. Patient is wheelchair bound due to arthritis of her knees. No abdominal pain or dysuria. On her initial physical examination blood pressure 137/91, temperature 98.2, pulse rate 96, respiratory rate 16, oxygen saturation 97%, her lungs were clear to auscultation bilaterally, heart S1-S2, present rhythmic, soft abdomen, no lower extremity edema. Sodium 136, potassium 3.2, chloride 104, bicarb 25, glucose 115, BUN 31, creatinine 1.39, white count 7.7, hemoglobin 12.8, hematocrit 36.8, platelets 212. SARS COVID-19 negative. Urinalysis specific gravity 1.018, 6-10 red cells, 21-50 white cells Chest radiograph with left rotation, right atelectasis. EKG 123 bpm, left axis deviation, left anterior fascicular block, normal QRS, normal QTC, atrial fibrillation, no ST segment T wave changes.  Assessment & Plan:   Principal Problem:   Acute metabolic encephalopathy Active Problems:   Atrial fibrillation with RVR (HCC)   Essential hypertension, benign   Dyslipidemia   Chronic ischemic left MCA stroke   Weakness   Acute lower UTI   CKD (chronic kidney disease) stage 3, GFR 30-59 ml/min   1. AKI on CKD stage 3b, hypokalemia. Likely due to dehydration, patient continue to be very weak and deconditioned.  Renal function with serum cr at 1,14 with K at 3,6 and serum bicarbonate at 25.   Will continue hydration with balanced electrolyte solutions and will follow up on renal panel in am. Avoid hypotension and nephrotoxic medications.   2.  Urinary tract infection. (present on admission). No frank urinary symptoms, positive pyuria. Will follow up on cultures and will plan a short 3 day course of antibiotic therapy.   3. HTN/ dyslipidemia. Will continue hold on antihypertensive medications. Blood pressure systolic 139 mmHg.   Continue with statin therapy.   4. Atrial fibrillation, chronic. Continue rate control with metoprolol, patient not on anticoagulation, likely due to fall risk.   5. Gout. No acute flare, will continue with allopurinol.   6. Hx of CVA. Patient will get PT and OT evaluation.   Patient continue to be at high risk for AKI  Status is: Inpatient  Remains inpatient appropriate because:IV treatments appropriate due to intensity of illness or inability to take PO   Dispo: The patient is from: Home              Anticipated d/c is to: SNF              Anticipated d/c date is: 3 days              Patient currently is not medically stable to d/c.   DVT prophylaxis: Enoxaparin   Code Status:   full  Family Communication:  I spoke with patient's daughter at the bedside, we talked in detail about patient's condition, plan of care and prognosis and all questions were addressed.      Antimicrobials:   Ceftriaxone     Subjective: Patient continue to be very weak and deconditioned, denies any dysuria or abdominal pain, no nausea or vomiting, at home with poor oral intake.   Objective: Vitals:   11/06/19 0300 11/06/19 0506 11/06/19 0636 11/06/19 1000  BP: 128/68 131/73 (!) 87/57 (!) 138/97  Pulse: 85 71 75 98  Resp: 19 18 18 19   Temp:      TempSrc:      SpO2: 97% 96% 99% 97%  Weight:      Height:        Intake/Output Summary (Last 24 hours) at 11/06/2019 1619 Last data filed at 11/06/2019 0531 Gross per 24 hour  Intake 150 ml  Output --  Net 150 ml   Filed Weights   11/05/19 1316  Weight: 104.3 kg    Examination:   General: Not in pain or dyspnea, deconditioned and ill looking appearing   Neurology: Awake and alert, non focal  E ENT: positive pallor, no icterus, oral mucosa  dry Cardiovascular: No JVD. S1-S2 present, rhythmic, no gallops, rubs, or murmurs. No lower extremity edema. Pulmonary: positive breath sounds bilaterally, decreased at the dependent zones, anterior ausculation Gastrointestinal. Abdomen protuberant, soft and non tender Skin. No rashes Musculoskeletal: no joint deformities     Data Reviewed: I have personally reviewed following labs and imaging studies  CBC: Recent Labs  Lab 11/05/19 1327 11/06/19 0433  WBC 7.7 8.9  HGB 12.8 12.4  HCT 36.8 35.7*  MCV 95.8 96.2  PLT 212 196   Basic Metabolic Panel: Recent Labs  Lab 11/05/19 1327 11/05/19 2357 11/06/19 0433  NA 136  --  138  K 3.2*  --  3.6  CL 104  --  101  CO2 25  --  25  GLUCOSE 115*  --  145*  BUN 31*  --  27*  CREATININE 1.39*  --  1.14*  CALCIUM 9.1  --  8.8*  MG  --  1.7  --    GFR: Estimated Creatinine Clearance: 42.4 mL/min (A) (by C-G formula based on SCr of 1.14 mg/dL (H)). Liver Function Tests: No results for input(s): AST, ALT, ALKPHOS, BILITOT, PROT, ALBUMIN in the last 168 hours. No results for input(s): LIPASE, AMYLASE in the last 168 hours. No results for input(s): AMMONIA in the last 168 hours. Coagulation Profile: Recent Labs  Lab 11/06/19 0313  INR 2.8*   Cardiac Enzymes: No results for input(s): CKTOTAL, CKMB, CKMBINDEX, TROPONINI in the last 168 hours. BNP (last 3 results) No results for input(s): PROBNP in the last 8760 hours. HbA1C: No results for input(s): HGBA1C in the last 72 hours. CBG: No results for input(s): GLUCAP in the last 168 hours. Lipid Profile: No results for input(s): CHOL, HDL, LDLCALC, TRIG, CHOLHDL, LDLDIRECT in the last 72 hours. Thyroid Function Tests: No results for input(s): TSH, T4TOTAL, FREET4, T3FREE, THYROIDAB in the last 72 hours. Anemia Panel: No results for input(s): VITAMINB12, FOLATE, FERRITIN, TIBC, IRON,  RETICCTPCT in the last 72 hours.    Radiology Studies: I have reviewed all of the imaging during this hospital visit personally     Scheduled Meds: . allopurinol  100 mg Oral BID  . amLODipine  2.5 mg Oral Daily  . atorvastatin  40 mg Oral QPM  . docusate sodium  100 mg Oral Daily  . enoxaparin (LOVENOX) injection  40 mg Subcutaneous Q24H  . feeding supplement (ENSURE ENLIVE)  237 mL Oral BID  . feeding supplement (PRO-STAT SUGAR FREE 64)  30 mL Oral BID  . gabapentin  100 mg Oral TID  . losartan  100 mg Oral Daily   And  . hydrochlorothiazide  25 mg Oral Daily  . lidocaine  1 patch Transdermal Q24H  . metoprolol tartrate  75 mg  Oral BID  . polyethylene glycol  17 g Oral Daily   Continuous Infusions: . sodium chloride 100 mL/hr at 11/06/19 1332  . cefTRIAXone (ROCEPHIN)  IV Stopped (11/06/19 1618)     LOS: 0 days        Curlie Macken Annett Gula, MD

## 2019-11-06 NOTE — Consult Note (Signed)
ANTICOAGULATION CONSULT NOTE  Pharmacy Consult for Warfarin Indication: atrial fibrillation  Labs: Recent Labs    11/05/19 1327 11/05/19 2357 11/06/19 0313 11/06/19 0433  HGB 12.8  --   --  12.4  HCT 36.8  --   --  35.7*  PLT 212  --   --  196  LABPROT  --   --  28.6*  --   INR  --   --  2.8*  --   CREATININE 1.39*  --   --  1.14*  TROPONINIHS  --  12 15  --     Estimated Creatinine Clearance: 42.4 mL/min (A) (by C-G formula based on SCr of 1.14 mg/dL (H)).   Medical History: Past Medical History:  Diagnosis Date  . Arthritis   . Atrial fibrillation (HCC)    unspecified  . Carotid stenosis    nonhemodynamic  . Carpal tunnel syndrome on right   . Chronic kidney disease   . Diabetes mellitus type 2, uncomplicated (HCC)   . Essential tremor   . Glaucoma   . Gout   . Gout   . Hyperlipidemia   . Hypertension   . Obesity    unspecified  . Stroke [facility])     Medications:  Warfarin 5 mg daily prior to admission (last dose per medication reconciliation was 11/05/19, although patient was in the ED at least since around noon that day). Dosing regimen confirmed with patient's daughter by medication reconciliation technician.   Assessment: Patient is an 86 y/o F with medical history including atrial fibrillation on warfarin who is admitted with UTI.   Date INR Plan  11/06/19 2.8 Warfarin 2.5 mg    Goal of Therapy:  INR 2-3   Plan:  --INR today at upper end of therapeutic range. Anticipate increased sensitivity to warfarin with antibiotics. Will order warfarin 2.5 mg x 1 tonight. --Daily INR per protocol --CBC per protocol  Erica Warren 11/06/2019,6:39 PM

## 2019-11-06 NOTE — ED Notes (Signed)
Pt seems very confused regarding where she is, pt keeps talking about being at home and asking RN to go into her living room to get her things, asking for home phone. Pt able to state correct month and year but continuously get disoriented to location and where she is. Pt states "I have to figure out how to get out of here", after RN attempts to orient pt to location.

## 2019-11-06 NOTE — H&P (Signed)
[place]   PATIENT NAME: Erica Warren    MR#:  [id number]  DATE OF BIRTH:  [birth year]  DATE OF ADMISSION:  11/05/2019  PRIMARY CARE PHYSICIAN: Lauro Regulus, MD   REQUESTING/REFERRING PHYSICIAN: Chesley Noon, MD  CHIEF COMPLAINT:   Chief Complaint  Patient presents with  . Weakness    HISTORY OF PRESENT ILLNESS:  Erica Warren  is a 86 y.o. Caucasian female with a known history of type 2 diabetes mellitus, hypertension, dyslipidemia, gout and atrial fibrillation, who presented to the emergency room with acute onset of generalized weakness over the last 3 to 4 days with associated pain in the groin significant dysuria, oliguria or hematuria or urgency or frequency or flank pain.  She denied chest pain or cough or wheezing.  She admitted to dry cough.  She denies any palpitations though she was noted to be in atrial fibrillation with rapid ventricular response.  When she came to the ER blood pressure was 137/91 with a pulse of 102 227 with normal pulse currently.  Labs revealed calcium 8.8 and glucose of 145, high-sensitivity troponin I 15 normal CBC.  INR was 2.8 and PT 20.6.  Urinalysis was positive for UTI.  Noncontrasted CT scan showed no acute cardiopulmonary disease. EKG showed atrial fibrillation with rapid ventricular response of 122 with incomplete right bundle branch block and Q waves septally.  The patient was given IV Rocephin, 5 mg IV Lopressor and 50 mg p.o. Lopressor as well as 40 mEq p.o. potassium chloride.  He will be admitted to a medically monitored bed for further evaluation and management. PAST MEDICAL HISTORY:   Past Medical History:  Diagnosis Date  . Arthritis   . Atrial fibrillation (HCC)    unspecified  . Carotid stenosis    nonhemodynamic  . Carpal tunnel syndrome on right   . Chronic kidney disease   . Diabetes mellitus type 2, uncomplicated (HCC)   . Essential tremor   . Glaucoma   . Gout   . Gout   . Hyperlipidemia   .  Hypertension   . Obesity    unspecified  . Stroke [facility])     PAST SURGICAL HISTORY:   Past Surgical History:  Procedure Laterality Date  . ABDOMINAL HYSTERECTOMY  1982  . CATARACT EXTRACTION EXTRACAPSULAR Right 2006  . CATARACT EXTRACTION EXTRACAPSULAR Left 2004  . CHOLECYSTECTOMY  1986    SOCIAL HISTORY:   Social History   Tobacco Use  . Smoking status: Never Smoker  . Smokeless tobacco: Never Used  Substance Use Topics  . Alcohol use: Not Currently    FAMILY HISTORY:   Family History  Problem Relation Age of Onset  . Heart failure Mother   . Hypertension Mother   . Stroke Father   . Hypertension Brother     DRUG ALLERGIES:  No Known Allergies  REVIEW OF SYSTEMS:   ROS As per history of present illness. All pertinent systems were reviewed above. Constitutional, HEENT, cardiovascular, respiratory, GI, GU, musculoskeletal, neuro, psychiatric, endocrine, integumentary and hematologic systems were reviewed and are otherwise negative/unremarkable except for positive findings mentioned above in the HPI.   MEDICATIONS AT HOME:   Prior to Admission medications   Medication Sig Start Date End Date Taking? Authorizing Provider  allopurinol (ZYLOPRIM) 100 MG tablet Take 100 mg by mouth 2 (two) times daily.   Yes [provider]  Amino Acids-Protein Hydrolys (FEEDING SUPPLEMENT, PRO-STAT SUGAR FREE 64,) LIQD Take 30 mLs by mouth 2 (two)  times daily.   Yes [provider]  amLODipine (NORVASC) 2.5 MG tablet Take 2.5 mg by mouth daily.   Yes [provider]  atorvastatin (LIPITOR) 40 MG tablet Take 40 mg by mouth daily.   Yes [provider]  docusate sodium (COLACE) 100 MG capsule Take 100 mg by mouth daily.   Yes [provider]  gabapentin (NEURONTIN) 100 MG capsule Take 100 mg by mouth 3 (three) times daily.   Yes [provider]  losartan-hydrochlorothiazide (HYZAAR) 100-25 MG tablet Take 1 tablet by mouth daily.    Yes [provider]  Nutritional Supplements (ENSURE ENLIVE PO) Take 1 Bottle by mouth 2 (two) times daily.   Yes [provider]  polyethylene glycol (MIRALAX / GLYCOLAX) packet Take 17 g by mouth daily.   Yes [provider]  warfarin (COUMADIN) 5 MG tablet Take 5 mg by mouth daily at 4 PM.  10/25/19  Yes [provider]  acetaminophen (TYLENOL) 325 MG tablet Take 650 mg by mouth every 4 (four) hours as needed. for pain/ increased temp. May be administered orally, per G-tube if needed or rectally if unable to swallow (separate order). Maximum dose for 24 hours is 3,000 mg from all sources of Acetaminophen/ Tylenol    [provider]  lidocaine (LIDODERM) 5 % Place 1 patch onto the skin daily. Remove & Discard patch within 12 hours or as directed by MD Patient not taking: Reported on 11/06/2019    [provider]  Metoprolol Tartrate 75 MG TABS Take 1 tablet by mouth 2 (two) times daily. Hold for systolic BP less than 110, or pulse less than 60    [provider]      VITAL SIGNS:  Blood pressure 123/74, pulse 96, temperature 98.2 F (36.8 C), resp. rate 16, height 5\' 5"  (1.651 m), weight 104.3 kg, SpO2 97 %.  PHYSICAL EXAMINATION:  Physical Exam  GENERAL:  86 y.o.-year-old patient lying in the bed with no acute distress.  EYES: Pupils equal, round, reactive to light and accommodation. No scleral icterus. Extraocular muscles intact.  HEENT: Head atraumatic, normocephalic. Oropharynx and nasopharynx clear.  NECK:  Supple, no jugular venous distention. No thyroid enlargement, no tenderness.  LUNGS: Normal breath sounds bilaterally, no wheezing, rales,rhonchi or crepitation. No use of accessory muscles of respiration.  CARDIOVASCULAR: Regular rate and rhythm, S1, S2 normal. No murmurs, rubs, or gallops.  ABDOMEN: Soft, nondistended, nontender. Bowel sounds present. No organomegaly or mass.  EXTREMITIES: No pedal edema, cyanosis, or  clubbing.  NEUROLOGIC: Cranial nerves II through XII are intact. Muscle strength 5/5 in all extremities. Sensation intact. Gait not checked.  PSYCHIATRIC: The patient is alert and oriented x 3.  Normal affect and good eye contact. SKIN: No obvious rash, lesion, or ulcer.   LABORATORY PANEL:   CBC Recent Labs  Lab 11/05/19 1327  WBC 7.7  HGB 12.8  HCT 36.8  PLT 212   ------------------------------------------------------------------------------------------------------------------  Chemistries  Recent Labs  Lab 11/05/19 1327 11/05/19 2357  NA 136  --   K 3.2*  --   CL 104  --   CO2 25  --   GLUCOSE 115*  --   BUN 31*  --   CREATININE 1.39*  --   CALCIUM 9.1  --   MG  --  1.7   ------------------------------------------------------------------------------------------------------------------  Cardiac Enzymes No results for input(s): TROPONINI in the last 168 hours. ------------------------------------------------------------------------------------------------------------------  RADIOLOGY:  DG Chest Portable 1 View  Result Date: 11/06/2019  CLINICAL DATA:  Weakness EXAM: PORTABLE CHEST 1 VIEW COMPARISON:  05/21/2014 FINDINGS: Mild cardiomegaly with calcific aortic atherosclerosis. No pleural effusion or pneumothorax. No focal airspace consolidation or pulmonary edema. IMPRESSION: No active cardiopulmonary disease. Electronically Signed   By: Deatra Robinson M.D.   On: 11/06/2019 00:32      IMPRESSION AND PLAN:   1.  Generalized weakness possibly secondary to UTI. -The patient was admitted to a medical monitored bed. -She will be continued on antibiotic therapy with IV Rocephin. -She will be hydrated with IV normal saline. -Physical therapy consultation will be obtained.  2.  Paroxysmal atrial fibrillation with rapid ventricular response. -She has responded to as needed IV Lopressor. -We will utilize IV Cardizem as needed.  3.  Dyslipidemia -Statin therapy will be  resumed.  4.  Hypertension. -We will continue her antihypertensives.  5.  Gout. -We will continue her allopurinol.  6.  Peripheral neuropathy. -We will continue Neurontin.  7.  DVT prophylaxis. -Subtenons Lovenox.  All the records are reviewed and case discussed with ED provider. The plan of care was discussed in details with the patient (and family). I answered all questions. The patient agreed to proceed with the above mentioned plan. Further management will depend upon hospital course.   CODE STATUS: Full code  Status is: Inpatient  Remains inpatient appropriate because:Ongoing diagnostic testing needed not appropriate for outpatient work up, Unsafe d/c plan, IV treatments appropriate due to intensity of illness or inability to take PO and Inpatient level of care appropriate due to severity of illness   Dispo: The patient is from: Home              Anticipated d/c is to: Home              Anticipated d/c date is: 2 days              Patient currently is not medically stable to d/c.   TOTAL TIME TAKING CARE OF THIS PATIENT: 55 minutes.    Hannah Beat M.D on 11/06/2019 at 4:08 AM  [facility]   From 7 PM-7 AM, contact night-coverage [email or web address]  CC: Primary care physician; Lauro Regulus, MD   Note: This dictation was prepared with Dragon dictation along with smaller phrase technology. Any transcriptional typo errors that result from this process are unintentional.

## 2019-11-06 NOTE — ED Notes (Signed)
Admitting MD Mansy at bedside 

## 2019-11-06 NOTE — ED Notes (Addendum)
Report given to Mac in [place]. Pt transported by ED tech. IV antibiotics infusing

## 2019-11-07 LAB — CBC WITH DIFFERENTIAL/PLATELET
Abs Immature Granulocytes: 0.05 10*3/uL (ref 0.00–0.07)
Basophils Absolute: 0 10*3/uL (ref 0.0–0.1)
Basophils Relative: 1 %
Eosinophils Absolute: 0.1 10*3/uL (ref 0.0–0.5)
Eosinophils Relative: 1 %
HCT: 35.2 % — ABNORMAL LOW (ref 36.0–46.0)
Hemoglobin: 12 g/dL (ref 12.0–15.0)
Immature Granulocytes: 1 %
Lymphocytes Relative: 12 %
Lymphs Abs: 1 10*3/uL (ref 0.7–4.0)
MCH: 33.1 pg (ref 26.0–34.0)
MCHC: 34.1 g/dL (ref 30.0–36.0)
MCV: 97.2 fL (ref 80.0–100.0)
Monocytes Absolute: 1.1 10*3/uL — ABNORMAL HIGH (ref 0.1–1.0)
Monocytes Relative: 13 %
Neutro Abs: 6 10*3/uL (ref 1.7–7.7)
Neutrophils Relative %: 72 %
Platelets: 200 10*3/uL (ref 150–400)
RBC: 3.62 MIL/uL — ABNORMAL LOW (ref 3.87–5.11)
RDW: 14.8 % (ref 11.5–15.5)
WBC: 8.2 10*3/uL (ref 4.0–10.5)
nRBC: 0 % (ref 0.0–0.2)

## 2019-11-07 LAB — BASIC METABOLIC PANEL
Anion gap: 9 (ref 5–15)
BUN: 25 mg/dL — ABNORMAL HIGH (ref 8–23)
CO2: 25 mmol/L (ref 22–32)
Calcium: 8.6 mg/dL — ABNORMAL LOW (ref 8.9–10.3)
Chloride: 102 mmol/L (ref 98–111)
Creatinine, Ser: 1.18 mg/dL — ABNORMAL HIGH (ref 0.44–1.00)
GFR calc Af Amer: 48 mL/min — ABNORMAL LOW (ref >60)
GFR calc non Af Amer: 42 mL/min — ABNORMAL LOW (ref >60)
Glucose, Bld: 111 mg/dL — ABNORMAL HIGH (ref 70–99)
Potassium: 3.3 mmol/L — ABNORMAL LOW (ref 3.5–5.1)
Sodium: 136 mmol/L (ref 135–145)

## 2019-11-07 LAB — URINE CULTURE: Culture: >=100000 — AB

## 2019-11-07 LAB — PROTIME-INR
INR: 3.4 — ABNORMAL HIGH (ref 0.8–1.2)
Prothrombin Time: 33.1 seconds — ABNORMAL HIGH (ref 11.4–15.2)

## 2019-11-07 MED ORDER — PROSOURCE PLUS PO LIQD
30.0000 mL | Freq: Two times a day (BID) | ORAL | Status: DC
  Administered 2019-11-07 – 2019-11-08 (×2): 30 mL via ORAL
  Filled 2019-11-07: qty 30

## 2019-11-07 MED ORDER — POTASSIUM CHLORIDE CRYS ER 20 MEQ PO TBCR
20.0000 meq | EXTENDED_RELEASE_TABLET | Freq: Once | ORAL | Status: AC
  Administered 2019-11-07: 20 meq via ORAL
  Filled 2019-11-07: qty 1

## 2019-11-07 MED ORDER — CEPHALEXIN 500 MG PO CAPS
500.0000 mg | ORAL_CAPSULE | Freq: Three times a day (TID) | ORAL | Status: DC
  Administered 2019-11-07 – 2019-11-08 (×3): 500 mg via ORAL
  Filled 2019-11-07 (×3): qty 1

## 2019-11-07 MED ORDER — MORPHINE SULFATE (PF) 2 MG/ML IV SOLN
1.0000 mg | Freq: Once | INTRAVENOUS | Status: AC
  Administered 2019-11-07: 1 mg via INTRAVENOUS
  Filled 2019-11-07: qty 1

## 2019-11-07 MED ORDER — BOOST / RESOURCE BREEZE PO LIQD CUSTOM
1.0000 | Freq: Two times a day (BID) | ORAL | Status: DC
  Administered 2019-11-07 – 2019-11-08 (×2): 1 via ORAL

## 2019-11-07 NOTE — Progress Notes (Signed)
PROGRESS NOTE    Erica Warren  WGY:[id number] DOB: 1931-02-11 DOA: 11/05/2019 PCP: Lauro Regulus, MD    Brief Narrative:  Patient admitted to the hospital with the working diagnosis of acute kidney injury in the setting of urinary tract infection.   86 year old female past medical history of type 2 diabetes mellitus, hypertension, dyslipidemia, CKD stage 3b, gout and atrial fibrillation. She presented with generalized weakness for the last 3 to 4 days. Patient is wheelchair bound due to arthritis of her knees. No abdominal pain or dysuria. On her initial physical examination blood pressure 137/91, temperature 98.2, pulse rate 96, respiratory rate 16, oxygen saturation 97%, her lungs were clear to auscultation bilaterally, heart S1-S2, present rhythmic, soft abdomen, no lower extremity edema. Sodium 136, potassium 3.2, chloride 104, bicarb 25, glucose 115, BUN 31, creatinine 1.39, white count 7.7, hemoglobin 12.8, hematocrit 36.8, platelets 212. SARS COVID-19 negative. Urinalysis specific gravity 1.018, 6-10 red cells, 21-50 white cells Chest radiograph with left rotation, right atelectasis. EKG 123 bpm, left axis deviation, left anterior fascicular block, normal QRS, normal QTC, atrial fibrillation, no ST segment T wave changes.  Assessment & Plan:   Principal Problem:   Acute metabolic encephalopathy Active Problems:   Atrial fibrillation with RVR (HCC)   Essential hypertension, benign   Dyslipidemia   Chronic ischemic left MCA stroke   Weakness   Acute lower UTI   CKD (chronic kidney disease) stage 3, GFR 30-59 ml/min   1. AKI on CKD stage 3b, hypokalemia. Likely due to dehydration --s/p IVF PLAN: --d/c MIVF --encourage oral hydration  2. Urinary tract infection 2/2 E coli No frank urinary symptoms, positive pyuria.  --started on ceftriaxone --De-escalate to Keflex today  3. HTN --continue home metop  4. Atrial fibrillation, chronic.  --continue home  metop --continue home warfarin, pharm to dose  5. Gout.  No acute flare, will continue with allopurinol.   6. Hx of CVA.  --continue home statin   DVT prophylaxis: [id number] Code Status: Full code  Family Communication:  Status is: inpatient Dispo:   The patient is from: home Anticipated d/c is to: SNF Anticipated d/c date is: whenever bed available Patient currently is medically stable to d/c.   Subjective: Pt denied dysuria.  Had abdominal tenderness which pt attributed to needing to have a BM.     Objective: Vitals:   11/06/19 2204 11/07/19 0015 11/07/19 0435 11/07/19 1211  BP: 130/76 126/77 139/84 137/72  Pulse: 79 87 95 67  Resp:  20 20 18   Temp:  97.9 F (36.6 C) 98.1 F (36.7 C) 98 F (36.7 C)  TempSrc:  Oral Oral Oral  SpO2:  97% 99% 98%  Weight:  93.9 kg    Height:  5\' 5"  (1.651 m)      Intake/Output Summary (Last 24 hours) at 11/07/2019 1617 Last data filed at 11/07/2019 1528 Gross per 24 hour  Intake 2618.84 ml  Output 300 ml  Net 2318.84 ml   Filed Weights   11/05/19 1316 11/07/19 0015  Weight: 104.3 kg 93.9 kg    Examination:   Constitutional: NAD, AAOx3 HEENT: conjunctivae and lids normal, EOMI CV: irregular. Distal pulses +2.  No cyanosis.   RESP: CTA B/L, normal respiratory effort  GI: +BS, ND, tender to palpation diffusely Extremities: No effusions, edema, or tenderness in BLE SKIN: warm, dry and intact Neuro: II - XII grossly intact.  Sensation intact Psych: Normal mood and affect.      Data Reviewed: I have personally  reviewed following labs and imaging studies  CBC: Recent Labs  Lab 11/05/19 1327 11/06/19 0433 11/07/19 0624  WBC 7.7 8.9 8.2  NEUTROABS  --   --  6.0  HGB 12.8 12.4 12.0  HCT 36.8 35.7* 35.2*  MCV 95.8 96.2 97.2  PLT 212 196 200   Basic Metabolic Panel: Recent Labs  Lab 11/05/19 1327 11/05/19 2357 11/06/19 0433 11/07/19 0624  NA 136  --  138 136  K 3.2*  --  3.6 3.3*  CL 104  --  101 102  CO2  25  --  25 25  GLUCOSE 115*  --  145* 111*  BUN 31*  --  27* 25*  CREATININE 1.39*  --  1.14* 1.18*  CALCIUM 9.1  --  8.8* 8.6*  MG  --  1.7  --   --    GFR: Estimated Creatinine Clearance: 38.8 mL/min (A) (by C-G formula based on SCr of 1.18 mg/dL (H)). Liver Function Tests: No results for input(s): AST, ALT, ALKPHOS, BILITOT, PROT, ALBUMIN in the last 168 hours. No results for input(s): LIPASE, AMYLASE in the last 168 hours. No results for input(s): AMMONIA in the last 168 hours. Coagulation Profile: Recent Labs  Lab 11/06/19 0313 11/07/19 0624  INR 2.8* 3.4*   Cardiac Enzymes: No results for input(s): CKTOTAL, CKMB, CKMBINDEX, TROPONINI in the last 168 hours. BNP (last 3 results) No results for input(s): PROBNP in the last 8760 hours. HbA1C: No results for input(s): HGBA1C in the last 72 hours. CBG: No results for input(s): GLUCAP in the last 168 hours. Lipid Profile: No results for input(s): CHOL, HDL, LDLCALC, TRIG, CHOLHDL, LDLDIRECT in the last 72 hours. Thyroid Function Tests: No results for input(s): TSH, T4TOTAL, FREET4, T3FREE, THYROIDAB in the last 72 hours. Anemia Panel: No results for input(s): VITAMINB12, FOLATE, FERRITIN, TIBC, IRON, RETICCTPCT in the last 72 hours.    Radiology Studies: I have reviewed all of the imaging during this hospital visit personally     Scheduled Meds: . (feeding supplement) PROSource Plus  30 mL Oral BID BM  . allopurinol  100 mg Oral BID  . atorvastatin  40 mg Oral QPM  . cephALEXin  500 mg Oral Q8H  . docusate sodium  100 mg Oral Daily  . feeding supplement  1 Container Oral BID BM  . gabapentin  100 mg Oral TID  . lidocaine  1 patch Transdermal Q24H  . metoprolol tartrate  75 mg Oral BID  . polyethylene glycol  17 g Oral Daily  . Warfarin - Pharmacist Dosing Inpatient   Does not apply q1600   Continuous Infusions:    LOS: 1 day     Darlin Priestly, MD

## 2019-11-07 NOTE — Evaluation (Signed)
Occupational Therapy Evaluation Patient Details Name: Erica Warren MRN: [id number] DOB: Oct 12, 1930 Today's Date: 11/07/2019    History of Present Illness Per MD notes, 86 year old female past medical history of type 2 diabetes mellitus, hypertension, dyslipidemia, CKD stage 3b, gout and atrial fibrillation. She presented with generalized weakness for the last 3 to 4 days. Per chart, pt uses WC at baseline 2/2 BLE arthritis.   Clinical Impression   Erica Warren was seen for OT evaluation this date. Prior to hospital admission, pt was living alone in a 1-level home with a ramped entrance in the garage. Pt states she has been homebound for the last 2 years. She uses a WC for all household mobility, sleeps in a lift recliner, and sponge bathes at her kitchen sink. She reports her family assists her PRN with IADL management and occasional transfers to her WC. Currently pt demonstrates impairments in strength, activity tolerance, and pain which functionally limit her ability to perform ADL/self-care tasks. Pt currently requires set-up assist for UB tasks at bed level including self feeding and grooming, MAX A for bed level peri-care/toileting, and MAX A +2 for functional mobility/LB ADL management from STS.  Pt would benefit from skilled OT to address noted impairments and functional limitations (see below for any additional details) in order to maximize safety and independence while minimizing falls risk and caregiver burden. Upon hospital discharge, recommend STR to maximize pt safety and return to PLOF.      Follow Up Recommendations  SNF    Equipment Recommendations  None recommended by OT (Pt has BSC/necessary equipment)    Recommendations for Other Services       Precautions / Restrictions Precautions Precautions: Fall Precaution Comments: High Fall Restrictions Weight Bearing Restrictions: No      Mobility Bed Mobility               General bed mobility comments: Deferred. Pt  declines all OOB/EOB activity 2/2 BLE pain and fatigue.  Transfers                 General transfer comment: Deferred. Pt declines all OOB/EOB activity 2/2 BLE pain and fatigue.    Balance Overall balance assessment: Needs assistance Sitting-balance support: Feet unsupported;Bilateral upper extremity supported Sitting balance-Leahy Scale: Poor Sitting balance - Comments: Pt unable to support self in long sitting in bed this date with HOB elevated, reliant on back support to bring self upright in bed for self-feeding.                                   ADL either performed or assessed with clinical judgement   ADL Overall ADL's : Needs assistance/impaired                                       General ADL Comments: Pt is significantly functionally limited by generalized weakness, decreased activity tolerance, and pain in BLE. She requires set-up assist for self-feeding at bed level this date. MAX A for peri-care/toileting at bed level. Anticipate MAX A +2 for functional mobility and LB ADL management in seated position.     Vision Baseline Vision/History: Wears glasses (Does not have with her at the hospital.) Wears Glasses: Reading only Patient Visual Report: No change from baseline       Perception     Praxis  Pertinent Vitals/Pain Pain Assessment: Faces Faces Pain Scale: Hurts little more Pain Location: R heel pain Pain Descriptors / Indicators: Aching;Sore;Grimacing;Guarding Pain Intervention(s): Limited activity within patient's tolerance;Monitored during session;Repositioned     Hand Dominance Right   Extremity/Trunk Assessment Upper Extremity Assessment Upper Extremity Assessment: Generalized weakness (BUE generally weak grossly 3/5 t/o. Decreased shoulder flexion pt unable to bring arms up beyond ~80 independently and endorses this is due to fatigue/weaknes. Grip/FMC WFL)   Lower Extremity Assessment Lower Extremity  Assessment: Generalized weakness;Defer to PT evaluation (Pt endorses hx of BLE arthritis, states her knees are "bone grinding on bone" does not walk. Squat pivots to [facility] using a rug under feet and arms to push up at baseline.)       Communication Communication Communication: No difficulties   Cognition Arousal/Alertness: Lethargic Behavior During Therapy: WFL for tasks assessed/performed Overall Cognitive Status: No family/caregiver present to determine baseline cognitive functioning                                 General Comments: Pt generally A&O x4. Follows 1 step VCs consistently, however requires increased processing time and cueing for multistep VCs. No family/caregiver present to determine baseline. Generally lethargic t/o session.   General Comments       Exercises Other Exercises Other Exercises: Pt educated on role of OT in acute setting, falls prevention strategies, safe use of AE/DME for ADL management, importance of mobility/activity while in the hospital, and routines modifications to support safety and functional independence. Other Exercises: OT facilitates set-up of meal tray items for pt breakfast tray. See ADL section for additional detail.   Shoulder Instructions      Home Living Family/patient expects to be discharged to:: Private residence Living Arrangements: Alone Available Help at Discharge: Family;Available PRN/intermittently Type of Home: House Home Access: Ramped entrance (at [place])     Home Layout: One level     Bathroom Shower/Tub: Walk-in shower;Door   [employer]: Standard (comfort height c riser) Bathroom Accessibility: No   Home Equipment: Shower seat;Wheelchair - manual;Walker - standard;Grab bars - tub/shower   Additional Comments: Toilet riser, lift recilner      Prior Functioning/Environment Level of Independence: Needs assistance  Gait / Transfers Assistance Needed: Pt performs squat pivot to WC from lift recliner  daily. Uses feet and/or arms to propel WC. ADL's / Homemaking Assistance Needed: Independent for dressing, sponge bathes at kitchen sink, is able to transfer to commode with riser. Family assists with all IADL management including shopping, taking garbage to road, etc. Pt denies falls history in the last year.   Comments: Pt endorses she has not left her home more than once in the last 2 years. Family assists with performing at home INR tests and coordinating with her doctor.        OT Problem List: Decreased strength;Cardiopulmonary status limiting activity;Decreased coordination;Decreased activity tolerance;Decreased safety awareness;Impaired balance (sitting and/or standing);Decreased knowledge of use of DME or AE;Pain      OT Treatment/Interventions: Self-care/ADL training;Therapeutic exercise;Therapeutic activities;DME and/or AE instruction;Patient/family education;Balance training;Energy conservation    OT Goals(Current goals can be found in the care plan section) Acute Rehab OT Goals Patient Stated Goal: To get my strength back OT Goal Formulation: With patient Time For Goal Achievement: 11/21/19 Potential to Achieve Goals: Fair  OT Frequency: Min 2X/week   Barriers to D/C: Decreased caregiver support;Inaccessible home environment          Co-evaluation  AM-PAC OT "6 Clicks" Daily Activity     Outcome Measure Help from another person eating meals?: A Little Help from another person taking care of personal grooming?: A Little Help from another person toileting, which includes using toliet, bedpan, or urinal?: A Lot Help from another person bathing (including washing, rinsing, drying)?: A Lot Help from another person to put on and taking off regular upper body clothing?: A Lot Help from another person to put on and taking off regular lower body clothing?: A Lot 6 Click Score: 14   End of Session Nurse Communication: Other (comment);Mobility status (Pt  informed by nutrition services nsg would need to address heart health order. Pt insistent her doctor DC'd HH diet.)  Activity Tolerance: Patient limited by pain;Other (comment) (Pt self-limiting) Patient left: in bed;with call bell/phone within reach;with bed alarm set  OT Visit Diagnosis: Other abnormalities of gait and mobility (R26.89);Muscle weakness (generalized) (M62.81);Pain Pain - Right/Left: Right Pain - part of body: Leg;Ankle and joints of foot                Time: 7741-2878 OT Time Calculation (min): 36 min Charges:  OT General Charges $OT Visit: 1 Visit OT Evaluation $OT Eval Moderate Complexity: 1 Mod OT Treatments $Self Care/Home Management : 23-37 mins  Rockney Ghee, M.S., OTR/L Ascom: [phone] 11/07/19, 10:01 AM

## 2019-11-07 NOTE — Treatment Plan (Signed)
Tele called, Erica Warren , and made aware of d/c order.

## 2019-11-07 NOTE — Evaluation (Signed)
Physical Therapy Evaluation Patient Details Name: Erica Warren MRN: [id number] DOB: [birth year] Today's Date: 11/07/2019   History of Present Illness  Per MD notes, 86 year old female past medical history of type 2 diabetes mellitus, hypertension, dyslipidemia, CKD stage 3b, gout and atrial fibrillation. She presented with generalized weakness for the last 3 to 4 days. Per chart, pt uses WC at baseline 2/2 BLE arthritis.  Clinical Impression  Pt received lying in bed with nursing staff leaving room after looking patient's back. Pt reports fatigue but agreeable to participating in PT evaluation. Pt with limited ROM and strength in all 4 extremities and currently requires max A to perform rolling side to side secondary to weakness and fatigue. Pt's baseline of function is squat pivot transfers from manual wheelchair to lift/powered recliner chair and was able to perform without external physical assist. Her prior mobility was via manual wheelchair in which the patient would primarily propel with BLE. At this time, pt with deficits in pain, strength, endurance, and functional mobility. Pt would benefit from skilled PT to address deficits and recommend STR at discharge to optimize return to PLOF and maximize functional mobility, safety, and independence.       Follow Up Recommendations SNF;Supervision for mobility/OOB    Equipment Recommendations  None recommended by PT;Other (comment) (pt has already owns all equipment)    Recommendations for Other Services       Precautions / Restrictions Precautions Precautions: Fall Precaution Comments: High Fall Restrictions Weight Bearing Restrictions: No      Mobility  Bed Mobility Overal bed mobility: Needs Assistance Bed Mobility: Rolling Rolling: Max assist         General bed mobility comments: Pt rolled to both R and L requiring max A for initiating, reaching bedrails, and rolling into sidelying position; verbal cues for LE positioning  and to push with foot into bed to initiate rolling while reaching for bedrail.  Transfers                 General transfer comment: not attempted; pt deferred attempt due to fatigue  Ambulation/Gait             General Gait Details: not attempted  Stairs            Wheelchair Mobility    Modified Rankin (Stroke Patients Only)       Balance Overall balance assessment: Needs assistance Sitting-balance support: Feet unsupported;Bilateral upper extremity supported Sitting balance-Leahy Scale: Poor Sitting balance - Comments: not attempted                                     Pertinent Vitals/Pain Pain Assessment: Faces Faces Pain Scale: Hurts even more Pain Location: lower back (centralized in spine) and behind R knee Pain Descriptors / Indicators: Grimacing;Guarding;Moaning;Discomfort;Sore Pain Intervention(s): Monitored during session;Repositioned;Limited activity within patient's tolerance    Home Living Family/patient expects to be discharged to:: Skilled nursing facility Living Arrangements: Alone Available Help at Discharge: Family;Available PRN/intermittently Type of Home: House Home Access: Ramped entrance     Home Layout: One level Home Equipment: Wheelchair - manual;Walker - standard;Walker - 2 wheels;Grab bars - tub/shower Additional Comments: Toilet riser, lift chair    Prior Function Level of Independence: Needs assistance   Gait / Transfers Assistance Needed: Pt performs squat pivot to WC from lift recliner daily and from wheelchair <> toilet. Uses primarily feet to propel wheelchair. Pt denies fall history  in past 6 months.  ADL's / Homemaking Assistance Needed: Independent for dressing, sponge bathes at kitchen sink, is able to transfer to commode with riser. Family assists with all IADL management including shopping, paying bills, taking garbage to road, etc.  Comments: Pt reports sleeping in recliner lift chair. States  the to use the toilet, she gets wheelchair close to bathroom door (as it will not fit), uses one hand on the sink and other hand on a standard walker (that stays in the bathroom for support), and reports using a "bath mat" to pivot her feet on to get to/from toilet to wheelchair.     Hand Dominance   Dominant Hand: Right    Extremity/Trunk Assessment   Upper Extremity Assessment Upper Extremity Assessment: Generalized weakness;RUE deficits/detail;LUE deficits/detail RUE Deficits / Details: actively able to perform 1/2 arc; able to reach 2/4 arc passively with painful endrange RUE Sensation: WNL LUE Deficits / Details: activley able to reach 3/4 arc; able to passively reach more flexion though not full range LUE Sensation: WNL    Lower Extremity Assessment Lower Extremity Assessment: Generalized weakness;RLE deficits/detail;LLE deficits/detail RLE Deficits / Details: strength approx. 2 to 3+ grossly; pt with limited R knee extension, painful R hip movement so difficult to assess RLE: Unable to fully assess due to pain RLE Sensation: WNL LLE Deficits / Details: pt unable to reach full knee extension; strength approx. 2 to 3+ grossly LLE Sensation: WNL       Communication   Communication: No difficulties  Cognition Arousal/Alertness: Awake/alert Behavior During Therapy: WFL for tasks assessed/performed Overall Cognitive Status: Within Functional Limits for tasks assessed                                 General Comments: Pt able to correctly state name, DOB, day/month/year, and current location. Pt took two attempts to state situation for admission. Daughter arrived mid session and attested to all history pt gave.      General Comments      Exercises Other Exercises Other Exercises: Pt educated on role of OT in acute setting, falls prevention strategies, safe use of AE/DME for ADL management, importance of mobility/activity while in the hospital, and routines  modifications to support safety and functional independence. Other Exercises: OT facilitates set-up of meal tray items for pt breakfast tray. See ADL section for additional detail. Other Exercises: pt performed LLE heel slides, hip abd/add, assisted SLR, active AP and RLE assisted SLR and active AP x 10 each   Assessment/Plan    PT Assessment Patient needs continued PT services  PT Problem List Decreased strength;Decreased range of motion;Decreased activity tolerance;Decreased balance;Decreased mobility;Obesity;Pain       PT Treatment Interventions DME instruction;Functional mobility training;Therapeutic activities;Therapeutic exercise;Balance training;Patient/family education;Wheelchair mobility training    PT Goals (Current goals can be found in the Care Plan section)  Acute Rehab PT Goals Patient Stated Goal: To get my strength back PT Goal Formulation: With patient Time For Goal Achievement: 11/21/19 Potential to Achieve Goals: Fair    Frequency Min 2X/week   Barriers to discharge Decreased caregiver support      Co-evaluation               AM-PAC PT "6 Clicks" Mobility  Outcome Measure Help needed turning from your back to your side while in a flat bed without using bedrails?: A Lot Help needed moving from lying on your back to sitting on the  side of a flat bed without using bedrails?: A Lot Help needed moving to and from a bed to a chair (including a wheelchair)?: A Lot Help needed standing up from a chair using your arms (e.g., wheelchair or bedside chair)?: Total Help needed to walk in hospital room?: Total Help needed climbing 3-5 steps with a railing? : Total 6 Click Score: 9    End of Session   Activity Tolerance: Patient limited by fatigue;Patient limited by pain Patient left: in bed;with call bell/phone within reach;with bed alarm set;with family/visitor present Nurse Communication: Mobility status PT Visit Diagnosis: Unsteadiness on feet (R26.81);Muscle  weakness (generalized) (M62.81);Other abnormalities of gait and mobility (R26.89);Pain Pain - Right/Left: Right Pain - part of body: Knee (back/spine)    Time: 7169-6789 PT Time Calculation (min) (ACUTE ONLY): 41 min   Charges:              Frederich Chick, SPT  Frederich Chick 11/07/2019, 12:42 PM

## 2019-11-07 NOTE — Care Management (Signed)
Assessment complete.  Full note to follow.   Patient worked up for SNF.   Will need to present offers

## 2019-11-07 NOTE — Consult Note (Signed)
ANTICOAGULATION CONSULT NOTE  Pharmacy Consult for Warfarin Indication: atrial fibrillation  Labs: Recent Labs    11/05/19 1327 11/05/19 1327 11/05/19 2357 11/06/19 0313 11/06/19 0433 11/07/19 0624  HGB 12.8   < >  --   --  12.4 12.0  HCT 36.8  --   --   --  35.7* 35.2*  PLT 212  --   --   --  196 200  LABPROT  --   --   --  28.6*  --  33.1*  INR  --   --   --  2.8*  --  3.4*  CREATININE 1.39*  --   --   --  1.14* 1.18*  TROPONINIHS  --   --  12 15  --   --    < > = values in this interval not displayed.    Estimated Creatinine Clearance: 38.8 mL/min (A) (by C-G formula based on SCr of 1.18 mg/dL (H)).   Medical History: Past Medical History:  Diagnosis Date  . Arthritis   . Atrial fibrillation (HCC)    unspecified  . Carotid stenosis    nonhemodynamic  . Carpal tunnel syndrome on right   . Chronic kidney disease   . Diabetes mellitus type 2, uncomplicated (HCC)   . Essential tremor   . Glaucoma   . Gout   . Gout   . Hyperlipidemia   . Hypertension   . Obesity    unspecified  . Stroke [facility])     Medications:  Warfarin 5 mg daily prior to admission (last dose per medication reconciliation was 11/05/19, although patient was in the ED at least since around noon that day). Dosing regimen confirmed with patient's daughter by medication reconciliation technician.   Assessment: Patient is an 86 y/o F with medical history including atrial fibrillation on warfarin who is admitted with UTI.   Date INR Plan  11/06/19 2.8 Warfarin 2.5 mg  9/1 3.4 HOLD           DDI: allopurinol, Ceftriaxone  Goal of Therapy:  INR 2-3   Plan:  --INR supratherapeutic. Will hold warfarin tonight. F/u INR in am Anticipate increased sensitivity to warfarin with antibiotics. --Daily INR per protocol --CBC per protocol  Elija Mccamish A 11/07/2019,7:45 AM

## 2019-11-07 NOTE — Progress Notes (Signed)
Initial Nutrition Assessment  DOCUMENTATION CODES:   Obesity unspecified  INTERVENTION:  Discontinue Pro-stat as this is no longer on formulary Discontinue Ensure due to pt dislike  Boost Breeze po BID, each supplement provides 250 kcal and 9 grams of protein Magic cup BID with meals, each supplement provides 290 kcal and 9 grams of protein ProSource Plus 30 ml po BID, each supplement provides 100 kcal and 15 grams of protein MVI with minerals daily   NUTRITION DIAGNOSIS:   Inadequate oral intake related to decreased appetite as evidenced by per patient/family report.  GOAL:   Patient will meet greater than or equal to 90% of their needs    MONITOR:   PO intake, Supplement acceptance, Weight trends, Labs, I & O's  REASON FOR ASSESSMENT:   Consult Assessment of nutrition requirement/status  ASSESSMENT:  RD working remotely.  86 year old female with history of DM2, HTN, HLD, gout, Afib, and CKD stage IIIb who is wheelchair bound due to arthritis in bilateral knees presented with acute onset generalized weakness over the last 3-4 days with associated pain in groin admitted with acute metabolic encephalopathy  Able to speak with pt via phone this afternoon. She endorsed poor po intake of breakfast due to dislike, says french toast was soggy and the chicken sausage was more like a hockey puck than sausage. Her diet was liberalized to regular, reports lunch was better and recalls eating a little bit of spaghetti and salad. Patient reports that she loves to cook, however has not felt like it in a while and doesn't eat as much. Recalls [employer] biscuits for breakfast, peanut butter crackers for lunch and light dinner. Patient reports unopened Ensure at bedside, she does not like milky drinks, will discontinue. RD educated on the importance of adequate nutrition, she is amenable to trying [employer] and [employer] supplements to aid with meeting needs.   Patient recalls UBW  210-220 lbs No recent wt history for review, per Care Everywhere pt weighed 94.8 kg (208.56 lb) on 03/27/18 and currently she weighs (93.9 kg) 206.58 lb  I/Os: +2358 ml since admit  Medications reviewed and include: Keflex, Colace, Gabapentin, Miralax, Warfarin Labs: K 3.3 (L), BUN 25 (H), Cr 1.18 (H)  NUTRITION - FOCUSED PHYSICAL EXAM: Unable to perform at this time, RD working remotely.  Diet Order:   Diet Order            Diet regular Room service appropriate? Yes; Fluid consistency: Thin  Diet effective now                 EDUCATION NEEDS:   Education needs have been addressed  Skin:  Skin Assessment: Skin Integrity Issues: Skin Integrity Issues:: Other (Comment) Other: abraison;ecchymosis;right lower leg  Last BM:  8/31  Height:   Ht Readings from Last 1 Encounters:  11/07/19 5\' 5"  (1.651 m)    Weight:   Wt Readings from Last 1 Encounters:  11/07/19 93.9 kg    Ideal Body Weight:  56.8 kg  BMI:  Body mass index is 34.45 kg/m.  Estimated Nutritional Needs:   Kcal:  1700-1950  Protein:  85-98  Fluid:  >/= 1.7 L   01/07/20, RD, LDN Clinical Nutrition After Hours/Weekend Pager # in Amion

## 2019-11-08 LAB — BASIC METABOLIC PANEL
Anion gap: 6 (ref 5–15)
BUN: 23 mg/dL (ref 8–23)
CO2: 27 mmol/L (ref 22–32)
Calcium: 8.5 mg/dL — ABNORMAL LOW (ref 8.9–10.3)
Chloride: 103 mmol/L (ref 98–111)
Creatinine, Ser: 1.03 mg/dL — ABNORMAL HIGH (ref 0.44–1.00)
GFR calc Af Amer: 57 mL/min — ABNORMAL LOW (ref >60)
GFR calc non Af Amer: 49 mL/min — ABNORMAL LOW (ref >60)
Glucose, Bld: 97 mg/dL (ref 70–99)
Potassium: 3.3 mmol/L — ABNORMAL LOW (ref 3.5–5.1)
Sodium: 136 mmol/L (ref 135–145)

## 2019-11-08 LAB — CBC
HCT: 36.5 % (ref 36.0–46.0)
Hemoglobin: 12.5 g/dL (ref 12.0–15.0)
MCH: 33.1 pg (ref 26.0–34.0)
MCHC: 34.2 g/dL (ref 30.0–36.0)
MCV: 96.6 fL (ref 80.0–100.0)
Platelets: 205 10*3/uL (ref 150–400)
RBC: 3.78 MIL/uL — ABNORMAL LOW (ref 3.87–5.11)
RDW: 14.8 % (ref 11.5–15.5)
WBC: 8.6 10*3/uL (ref 4.0–10.5)
nRBC: 0 % (ref 0.0–0.2)

## 2019-11-08 LAB — PROTIME-INR
INR: 3.1 — ABNORMAL HIGH (ref 0.8–1.2)
Prothrombin Time: 31.2 seconds — ABNORMAL HIGH (ref 11.4–15.2)

## 2019-11-08 LAB — MAGNESIUM: Magnesium: 1.7 mg/dL (ref 1.7–2.4)

## 2019-11-08 MED ORDER — POTASSIUM CHLORIDE CRYS ER 20 MEQ PO TBCR
40.0000 meq | EXTENDED_RELEASE_TABLET | Freq: Once | ORAL | Status: AC
  Administered 2019-11-08: 40 meq via ORAL
  Filled 2019-11-08: qty 2

## 2019-11-08 MED ORDER — CEPHALEXIN 500 MG PO CAPS: 500.0000 mg | ORAL_CAPSULE | Freq: Three times a day (TID) | ORAL | 0 refills | Status: DC

## 2019-11-08 MED ORDER — WARFARIN SODIUM 1 MG PO TABS
1.0000 mg | ORAL_TABLET | Freq: Once | ORAL | Status: DC
  Filled 2019-11-08: qty 1

## 2019-11-08 MED ORDER — LOSARTAN POTASSIUM-HCTZ 100-25 MG PO TABS: ORAL_TABLET | ORAL | Status: DC

## 2019-11-08 MED ORDER — AMLODIPINE BESYLATE 2.5 MG PO TABS: ORAL_TABLET | ORAL | Status: DC

## 2019-11-08 NOTE — TOC Initial Note (Addendum)
Transition of Care [facility]) - Initial/Assessment Note    Patient Details  Name: Erica Warren MRN: [id number] Date of Birth: Sep 22, 1930  Transition of Care [facility]) CM/SW Contact:    Chapman Fitch, RN Phone Number: 11/08/2019, 9:53 AM  Clinical Narrative:                   Expected Discharge Plan: Skilled Nursing Facility Barriers to Discharge: No Barriers Identified   Patient Goals and CMS Choice     Late Entry  RNCM attempted to reach patient by phone to complete assessment.  There was no answer.   RNCM reached out to daughter Zella Ball to complete assessment  Patient lives at home alone Family lives locally PCP Dareen Piano - patient hasn't been out of the house in a year, but when does go out daughter provides transportation  [facility]den[facility] issues [facility]  Patient has a lift chair, WC, and RW in the home  PT has assessed patient and recommends SNF.  Daughter states she has discussed with the patient and they are both in agreement  Bed offers presented to daughter.  [facility] and rehab selected  RNCM has confirmed that [facility] is waiving the 3 night medicare stay Patient has not been vaccinated and will be on quarantine for 2 weeks when she arrives at the facility.  Daughter is aware   Update:  EmS packet on chart EMS transport called    Expected Discharge Plan and Services Expected Discharge Plan: Skilled Nursing Facility   Discharge Planning Services: CM Consult Post Acute Care Choice: Skilled Nursing Facility Living arrangements for the past 2 months: Single Family Home                                      Prior Living Arrangements/Services Living arrangements for the past 2 months: Single Family Home Lives with:: Self Patient language and need for interpreter reviewed:: Yes Do you feel safe going back to the place where you live?: Yes      Need for Family Participation in Patient Care: Yes (Comment) Care giver support system in  place?: Yes (comment) Current home services: DME Criminal Activity/Legal Involvement Pertinent to Current Situation/Hospitalization: No - Comment as needed  Activities of Daily Living Home Assistive Devices/Equipment: Wheelchair ADL Screening (condition at time of admission) Patient's cognitive ability adequate to safely complete daily activities?: No Is the patient deaf or have difficulty hearing?: No Does the patient have difficulty seeing, even when wearing glasses/contacts?: No Does the patient have difficulty concentrating, remembering, or making decisions?: Yes Patient able to express need for assistance with ADLs?: Yes Does the patient have difficulty dressing or bathing?: Yes Independently performs ADLs?: No Communication: Independent Dressing (OT): Needs assistance Is this a change from baseline?: Change from baseline, expected to last >3 days Feeding: Needs assistance Is this a change from baseline?: Change from baseline, expected to last >3 days Bathing: Needs assistance Is this a change from baseline?: Change from baseline, expected to last >3 days Toileting: Needs assistance Is this a change from baseline?: Change from baseline, expected to last >3days In/Out Bed: Needs assistance Is this a change from baseline?: Change from baseline, expected to last >3 days Walks in Home: Needs assistance Is this a change from baseline?: Change from baseline, expected to last >3 days Does the patient have difficulty walking or climbing stairs?: Yes Weakness of Legs: Both Weakness of  Arms/Hands: Both  Permission Sought/Granted                  Emotional Assessment              Admission diagnosis:  Weakness [R53.1] Acute cystitis without hematuria [N30.00] Generalized weakness [R53.1] Atrial fibrillation with RVR (HCC) [I48.91] Patient Active Problem List   Diagnosis Date Noted  . Weakness 11/06/2019  . Acute lower UTI 11/06/2019  . Acute metabolic encephalopathy  11/06/2019  . CKD (chronic kidney disease) stage 3, GFR 30-59 ml/min 11/06/2019  . Atrial fibrillation with RVR (HCC) 09/20/2017  . Essential hypertension, benign 09/20/2017  . Chronic gout of multiple sites 09/20/2017  . Dyslipidemia 09/20/2017  . Chronic constipation 09/20/2017  . Chronic ischemic left MCA stroke 09/20/2017   PCP:  Lauro Regulus, MD Pharmacy:   [facility] [phone] [facility] - [address] [place] [address] Phone: [phone] Fax: [phone]     Social Determinants of Health (SDOH) Interventions    Readmission Risk Interventions No flowsheet data found.

## 2019-11-08 NOTE — Discharge Summary (Signed)
Physician Discharge Summary   Erica Warren  female DOB: 1930-04-03  ERX:[id number]  PCP: Lauro Regulus, MD  Admit date: 11/05/2019 Discharge date: 11/08/2019  Admitted From: home Disposition:  SNF CODE STATUS: Full code   Hospital Course:  For full details, please see H&P, progress notes, consult notes and ancillary notes.  Briefly,  Erica Warren is a 86 year old Caucasian female past medical history of type 2 diabetes mellitus, hypertension, dyslipidemia, CKD stage 3b, gout and atrial fibrillation. She presented with generalized weakness for the 3 to 4 days.   Weakness and debility Patient is wheelchair bound due to arthritis of her knees.  Weakness likely due to worsening deconditioning, and maybe exacerbated by UTI.  PT/OT rec SNF rehab.  Urinary tract infection 2/2 E coli Pt reported some urinary frequency, but no dysuria or abdominal pain.  Positive pyuria.   Pt received 2 days of ceftriaxone and de-escalated to Keflex per urine cx sensitivities and completed a 3-day course.  CKD stage 3b AKI ruled out Cr 1.39 on presentation which is close to baseline.  After IVF, Cr 1.03 on the day of discharge, so pt likely did have some degree of dehydration.  Oral hydration encouraged.  Hypokalemia Repleted PRN  Hx of HTN Pt was continued on home metop, but home amlodipine and HYZAAR were held due to normal BP without them.  Both amlodipine and HYZAAR will continue to be held at discharge, pending outpatient followup.  Atrial fibrillation, chronic.  Continued home metop.  Continued home warfarin, pharm to dose.  INR therapeutic.  Hx of Gout.  No acute flare.  continued with allopurinol.   Hx of CVA.  continued home statin   Discharge Diagnoses:  Principal Problem:   Acute metabolic encephalopathy Active Problems:   Atrial fibrillation with RVR (HCC)   Essential hypertension, benign   Dyslipidemia   Chronic ischemic left MCA stroke   Weakness   Acute  lower UTI   CKD (chronic kidney disease) stage 3, GFR 30-59 ml/min    Discharge Instructions:  Allergies as of 11/08/2019   No Known Allergies     Medication List    STOP taking these medications   lidocaine 5 % Commonly known as: LIDODERM     TAKE these medications   acetaminophen 325 MG tablet Commonly known as: TYLENOL Take 650 mg by mouth every 4 (four) hours as needed. for pain/ increased temp. May be administered orally, per G-tube if needed or rectally if unable to swallow (separate order). Maximum dose for 24 hours is 3,000 mg from all sources of Acetaminophen/ Tylenol   allopurinol 100 MG tablet Commonly known as: ZYLOPRIM Take 100 mg by mouth 2 (two) times daily.   amLODipine 2.5 MG tablet Commonly known as: NORVASC Hold until outpatient followup since blood pressure has been normal in the hospital without this. What changed:   how much to take  how to take this  when to take this  additional instructions   atorvastatin 40 MG tablet Commonly known as: LIPITOR Take 40 mg by mouth daily.   docusate sodium 100 MG capsule Commonly known as: COLACE Take 100 mg by mouth daily.   ENSURE ENLIVE PO Take 1 Bottle by mouth 2 (two) times daily.   feeding supplement (PRO-STAT SUGAR FREE 64) Liqd Take 30 mLs by mouth 2 (two) times daily.   gabapentin 100 MG capsule Commonly known as: NEURONTIN Take 100 mg by mouth 3 (three) times daily.   losartan-hydrochlorothiazide 100-25 MG tablet  Commonly known as: HYZAAR Hold until outpatient followup since blood pressure has been normal in the hospital without this. What changed:   how much to take  how to take this  when to take this  additional instructions   Metoprolol Tartrate 75 MG Tabs Take 1 tablet by mouth 2 (two) times daily. Hold for systolic BP less than 110, or pulse less than 60   polyethylene glycol 17 g packet Commonly known as: MIRALAX / GLYCOLAX Take 17 g by mouth daily.   warfarin 5 MG  tablet Commonly known as: COUMADIN Take 5 mg by mouth daily at 4 PM.        Contact information for follow-up providers    Lauro Regulus, MD. Schedule an appointment as soon as possible for a visit in 1 week(s).   Specialty: Internal Medicine Contact information: [address] [facility] [place] [address] [phone]            Contact information for after-discharge care    Destination    [facility] .   Service: Skilled Nursing Contact information: [address] [facility] [place] [address] [phone]                  No Known Allergies   The results of significant diagnostics from this hospitalization (including imaging, microbiology, ancillary and laboratory) are listed below for reference.   Consultations:   Procedures/Studies: DG Chest Portable 1 View  Result Date: 11/06/2019 CLINICAL DATA:  Weakness EXAM: PORTABLE CHEST 1 VIEW COMPARISON:  05/21/2014 FINDINGS: Mild cardiomegaly with calcific aortic atherosclerosis. No pleural effusion or pneumothorax. No focal airspace consolidation or pulmonary edema. IMPRESSION: No active cardiopulmonary disease. Electronically Signed   By: Deatra Robinson M.D.   On: 11/06/2019 00:32      Labs: BNP (last 3 results) No results for input(s): BNP in the last 8760 hours. Basic Metabolic Panel: Recent Labs  Lab 11/05/19 1327 11/05/19 2357 11/06/19 0433 11/07/19 0624 11/08/19 0453  NA 136  --  138 136 136  K 3.2*  --  3.6 3.3* 3.3*  CL 104  --  101 102 103  CO2 25  --  25 25 27   GLUCOSE 115*  --  145* 111* 97  BUN 31*  --  27* 25* 23  CREATININE 1.39*  --  1.14* 1.18* 1.03*  CALCIUM 9.1  --  8.8* 8.6* 8.5*  MG  --  1.7  --   --  1.7   Liver Function Tests: No results for input(s): AST, ALT, ALKPHOS, BILITOT, PROT, ALBUMIN in the last 168 hours. No results for input(s): LIPASE, AMYLASE in the last 168 hours. No results for input(s): AMMONIA  in the last 168 hours. CBC: Recent Labs  Lab 11/05/19 1327 11/06/19 0433 11/07/19 0624 11/08/19 0453  WBC 7.7 8.9 8.2 8.6  NEUTROABS  --   --  6.0  --   HGB 12.8 12.4 12.0 12.5  HCT 36.8 35.7* 35.2* 36.5  MCV 95.8 96.2 97.2 96.6  PLT 212 196 200 205   Cardiac Enzymes: No results for input(s): CKTOTAL, CKMB, CKMBINDEX, TROPONINI in the last 168 hours. BNP: Invalid input(s): POCBNP CBG: No results for input(s): GLUCAP in the last 168 hours. D-Dimer No results for input(s): DDIMER in the last 72 hours. Hgb A1c No results for input(s): HGBA1C in the last 72 hours. Lipid Profile No results for input(s): CHOL, HDL, LDLCALC, TRIG, CHOLHDL, LDLDIRECT in the last 72  hours. Thyroid function studies No results for input(s): TSH, T4TOTAL, T3FREE, THYROIDAB in the last 72 hours.  Invalid input(s): FREET3 Anemia work up No results for input(s): VITAMINB12, FOLATE, FERRITIN, TIBC, IRON, RETICCTPCT in the last 72 hours. Urinalysis    Component Value Date/Time   COLORURINE YELLOW (A) 11/06/2019 0037   APPEARANCEUR CLOUDY (A) 11/06/2019 0037   LABSPEC 1.018 11/06/2019 0037   PHURINE 5.0 11/06/2019 0037   GLUCOSEU NEGATIVE 11/06/2019 0037   HGBUR MODERATE (A) 11/06/2019 0037   BILIRUBINUR NEGATIVE 11/06/2019 0037   KETONESUR NEGATIVE 11/06/2019 0037   PROTEINUR NEGATIVE 11/06/2019 0037   NITRITE NEGATIVE 11/06/2019 0037   LEUKOCYTESUR TRACE (A) 11/06/2019 0037   Sepsis Labs Invalid input(s): PROCALCITONIN,  WBC,  LACTICIDVEN Microbiology Recent Results (from the past 240 hour(s))  SARS Coronavirus 2 by RT PCR (hospital order, performed in [facility] hospital lab) Nasopharyngeal Nasopharyngeal Swab     Status: None   Collection Time: 11/06/19 12:05 AM   Specimen: Nasopharyngeal Swab  Result Value Ref Range Status   SARS Coronavirus 2 NEGATIVE NEGATIVE Final    Comment: (NOTE) SARS-CoV-2 target nucleic acids are NOT DETECTED.  The SARS-CoV-2 RNA is generally detectable in  upper and lower respiratory specimens during the acute phase of infection. The lowest concentration of SARS-CoV-2 viral copies this assay can detect is 250 copies / mL. A negative result does not preclude SARS-CoV-2 infection and should not be used as the sole basis for treatment or other patient management decisions.  A negative result may occur with improper specimen collection / handling, submission of specimen other than nasopharyngeal swab, presence of viral mutation(s) within the areas targeted by this assay, and inadequate number of viral copies (<250 copies / mL). A negative result must be combined with clinical observations, patient history, and epidemiological information.  Fact Sheet for Patients:   BoilerBrush.com.cy  Fact Sheet for Healthcare Providers: https://pope.com/  This test is not yet approved or  cleared by the [place] FDA and has been authorized for detection and/or diagnosis of SARS-CoV-2 by FDA under an Emergency Use Authorization (EUA).  This EUA will remain in effect (meaning this test can be used) for the duration of the COVID-19 declaration under Section 564(b)(1) of the Act, 21 U.S.C. section 360bbb-3(b)(1), unless the authorization is terminated or revoked sooner.  Performed at [facility] [address], [place], [place] [address]   Urine culture     Status: Abnormal   Collection Time: 11/06/19 12:37 AM   Specimen: Urine, Random  Result Value Ref Range Status   Specimen Description   Final    URINE, RANDOM Performed at [facility], [address] [place], [place] [address]    Special Requests   Final    NONE Performed at [facility], [address] [place], [place] [address]    Culture >=100,000 COLONIES/mL ESCHERICHIA COLI (A)  Final   Report Status 11/07/2019 FINAL  Final   Organism ID, Bacteria ESCHERICHIA COLI (A)  Final      Susceptibility    Escherichia coli - MIC*    AMPICILLIN <=2 SENSITIVE Sensitive     CEFAZOLIN <=4 SENSITIVE Sensitive     CEFTRIAXONE <=0.25 SENSITIVE Sensitive     CIPROFLOXACIN <=0.25 SENSITIVE Sensitive     GENTAMICIN <=1 SENSITIVE Sensitive     IMIPENEM <=0.25 SENSITIVE Sensitive     NITROFURANTOIN <=16 SENSITIVE Sensitive     TRIMETH/SULFA <=20 SENSITIVE Sensitive     AMPICILLIN/SULBACTAM <=2 SENSITIVE Sensitive  PIP/TAZO <=4 SENSITIVE Sensitive     * >=100,000 COLONIES/mL ESCHERICHIA COLI     Total time spend on discharging this patient, including the last patient exam, discussing the hospital stay, instructions for ongoing care as it relates to all pertinent caregivers, as well as preparing the medical discharge records, prescriptions, and/or referrals as applicable, is 30 minutes.    Darlin Priestlyina Dariel Betzer, MD  [facility] 11/08/2019, 10:37 AM  If 7PM-7AM, please contact night-coverage

## 2019-11-08 NOTE — Consult Note (Signed)
ANTICOAGULATION CONSULT NOTE  Pharmacy Consult for Warfarin Indication: atrial fibrillation  Labs: Recent Labs    11/05/19 2357 11/06/19 0313 11/06/19 0433 11/06/19 0433 11/07/19 0624 11/08/19 0453  HGB  --   --  12.4   < > 12.0 12.5  HCT  --   --  35.7*  --  35.2* 36.5  PLT  --   --  196  --  200 205  LABPROT  --  28.6*  --   --  33.1* 31.2*  INR  --  2.8*  --   --  3.4* 3.1*  CREATININE  --   --  1.14*  --  1.18* 1.03*  TROPONINIHS 12 15  --   --   --   --    < > = values in this interval not displayed.    Estimated Creatinine Clearance: 44.4 mL/min (A) (by C-G formula based on SCr of 1.03 mg/dL (H)).   Medical History: Past Medical History:  Diagnosis Date  . Arthritis   . Atrial fibrillation (HCC)    unspecified  . Carotid stenosis    nonhemodynamic  . Carpal tunnel syndrome on right   . Chronic kidney disease   . Diabetes mellitus type 2, uncomplicated (HCC)   . Essential tremor   . Glaucoma   . Gout   . Gout   . Hyperlipidemia   . Hypertension   . Obesity    unspecified  . Stroke [facility])     Medications:  Warfarin 5 mg daily prior to admission (last dose per medication reconciliation was 11/05/19, although patient was in the ED at least since around noon that day). Dosing regimen confirmed with patient's daughter by medication reconciliation technician.   Assessment: Patient is an 86 y/o F with medical history including atrial fibrillation on warfarin who is admitted with UTI.   Date INR Plan  11/06/19 2.8 Warfarin 2.5 mg  9/1 3.4 HOLD  9/2 3.1 1 mg       DDI: allopurinol, Ceftriaxone  Goal of Therapy:  INR 2-3   Plan:  --INR slightly supratherapeutic. Will order Warfarin 1 mg x 1 tonight. F/u INR in am Anticipate increased sensitivity to warfarin with antibiotics. --Daily INR per protocol --CBC per protocol  Erica Warren A 11/08/2019,2:54 PM

## 2019-11-08 NOTE — NC FL2 (Signed)
[place] MEDICAID FL2 LEVEL OF CARE SCREENING TOOL     IDENTIFICATION  Patient Name: Erica Warren Birthdate: 1931-01-11 Sex: female Admission Date (Current Location): 11/05/2019  [facility] and [place] Number:  Chiropodist and Address:         Provider Number: [id number]  Attending Physician Name and Address:  Darlin Priestly, MD  Relative Name and Phone Number:       Current Level of Care: Hospital Recommended Level of Care: Skilled Nursing Facility Prior Approval Number:    Date Approved/Denied:   PASRR Number: [id number] [place] [address]  Discharge Plan: SNF    Current Diagnoses: Patient Active Problem List   Diagnosis Date Noted  . Weakness 11/06/2019  . Acute lower UTI 11/06/2019  . Acute metabolic encephalopathy 11/06/2019  . CKD (chronic kidney disease) stage 3, GFR 30-59 ml/min 11/06/2019  . Atrial fibrillation with RVR (HCC) 09/20/2017  . Essential hypertension, benign 09/20/2017  . Chronic gout of multiple sites 09/20/2017  . Dyslipidemia 09/20/2017  . Chronic constipation 09/20/2017  . Chronic ischemic left MCA stroke 09/20/2017    Orientation RESPIRATION BLADDER Height & Weight     Self, Situation, Place  Normal Incontinent, External catheter Weight: 93.9 kg Height:  5\' 5"  (165.1 cm)  BEHAVIORAL SYMPTOMS/MOOD NEUROLOGICAL BOWEL NUTRITION STATUS      Continent Diet (Regular)  AMBULATORY STATUS COMMUNICATION OF NEEDS Skin   Extensive Assist Verbally Skin abrasions, Bruising                       Personal Care Assistance Level of Assistance              Functional Limitations Info             SPECIAL CARE FACTORS FREQUENCY  PT (By licensed PT), OT (By licensed OT)                    Contractures Contractures Info: Not present    Additional Factors Info  Code Status, Allergies Code Status Info: Full Allergies Info: NKDA           Current Medications (11/08/2019):  This is the current hospital  active medication list Current Facility-Administered Medications  Medication Dose Route Frequency Provider Last Rate Last Admin  . (feeding supplement) PROSource Plus liquid 30 mL  30 mL Oral BID BM 01/08/2020, MD   30 mL at 11/08/19 1051  . allopurinol (ZYLOPRIM) tablet 100 mg  100 mg Oral BID Mansy, Jan A, MD   100 mg at 11/08/19 1046  . atorvastatin (LIPITOR) tablet 40 mg  40 mg Oral QPM Arrien, 01/08/20, MD   40 mg at 11/07/19 1657  . docusate sodium (COLACE) capsule 100 mg  100 mg Oral Daily Mansy, Jan A, MD   100 mg at 11/08/19 1046  . feeding supplement (BOOST / RESOURCE BREEZE) liquid 1 Container  1 Container Oral BID BM 01/08/20, MD   1 Container at 11/08/19 1051  . gabapentin (NEURONTIN) capsule 100 mg  100 mg Oral TID Mansy, Jan A, MD   100 mg at 11/08/19 1046  . lidocaine (LIDODERM) 5 % 1 patch  1 patch Transdermal Q24H Mansy, Jan A, MD   1 patch at 11/08/19 0505  . magnesium hydroxide (MILK OF MAGNESIA) suspension 30 mL  30 mL Oral Daily PRN Mansy, Jan A, MD      . metoprolol tartrate (LOPRESSOR) injection 5 mg  5 mg  Intravenous Q5 min PRN Chesley Noon, MD   5 mg at 11/06/19 0014  . metoprolol tartrate (LOPRESSOR) tablet 75 mg  75 mg Oral BID Mansy, Jan A, MD   75 mg at 11/08/19 1045  . ondansetron (ZOFRAN) tablet 4 mg  4 mg Oral Q6H PRN Mansy, Jan A, MD       Or  . ondansetron [facility]) injection 4 mg  4 mg Intravenous Q6H PRN Mansy, Jan A, MD      . polyethylene glycol (MIRALAX / GLYCOLAX) packet 17 g  17 g Oral Daily Mansy, Jan A, MD   17 g at 11/08/19 1046  . traZODone (DESYREL) tablet 25 mg  25 mg Oral QHS PRN Mansy, Vernetta Honey, MD      . Warfarin - Pharmacist Dosing Inpatient   Does not apply q1600 Tressie Ellis [facility]         Discharge Medications: Please see discharge summary for a list of discharge medications.  Relevant Imaging Results:  Relevant Lab Results:   Additional Information ss [id number]  Chapman Fitch, RN

## 2019-11-08 NOTE — Progress Notes (Addendum)
Report called to [facility].

## 2019-12-21 ENCOUNTER — Encounter: Payer: Self-pay | Admitting: Primary Care

## 2019-12-21 ENCOUNTER — Non-Acute Institutional Stay: Payer: Medicare Other | Admitting: Primary Care

## 2019-12-21 ENCOUNTER — Other Ambulatory Visit: Payer: Self-pay

## 2019-12-21 DIAGNOSIS — Z515 Encounter for palliative care: Secondary | ICD-10-CM

## 2019-12-21 DIAGNOSIS — N1832 Chronic kidney disease, stage 3b: Secondary | ICD-10-CM

## 2019-12-21 DIAGNOSIS — G9341 Metabolic encephalopathy: Secondary | ICD-10-CM

## 2019-12-21 NOTE — Progress Notes (Signed)
.    [facility] Consult Note Telephone: [phone]  Fax: [phone]  PATIENT NAME: Erica Warren [place] [id number] [phone] (home)  DOB: [birth year] MRN: [id number]  PRIMARY CARE PROVIDER:    Alvester Morin, MD,  [address] [place] [address] [phone]  REFERRING PROVIDER:   Alvester Morin, MD [address] [place],  [place] [address] [phone]  RESPONSIBLE PARTY:   Extended Emergency Contact Information Primary Emergency Contact: Alric Ran Address: [place], [place] [address] Johnnette Litter of [place] Phone: [phone] Relation: Daughter Secondary Emergency Contact: Suszanne Finch Address: [place], [place] [address] Johnnette Litter of [place] Phone: [phone] Relation: Granddaughter  I met face to face with patient  In the facility.   ASSESSMENT AND RECOMMENDATIONS:   1. Advance Care Planning/Goals of Care: Goals include to maximize quality of life and symptom management. Our advance care planning conversation included a discussion about:     The value and importance of advance care planning   Experiences with loved ones who have been seriously ill or have died   Exploration of personal, cultural or spiritual beliefs that might influence medical decisions   Exploration of goals of care in the event of a sudden injury or illness   Identification  of a healthcare agent - daughter is POA  Review  of an  advance directive document .  Decision to de-escalate disease focused treatments due to poor prognosis.  Her daughter states that she has been living alone but became more debilitated after a stroke and is now no longer able to care for  herself. She has periods of confusion and  delirium and has recently been having hallucinations. She also states that her husband, who has passed away, has been  to see her.  Her daughter endorses DNR as advance care plan. We also discussed hospice when appropriate. Her father had been a hospice patient and so she was familiar. She did request a gone from my sight book which I will put into the [place] mail. I will continue to follow for palliative assessment and hospice referral when appropriate.  2. Symptom Management:   I met with Mrs. Siciliano in her nursing home room. She was sitting in high Fowlers in bed with oxygen at 2 L. She described herself as not in any pain but she seemed slightly dyspneic, or anxious. She states that her appetite has been good and that her bowels have been functioning. Her lungs were clear and she did not exhibit cough. She appeared to be a fair historian and so I can called her daughter to corroborate her history.  Daughter outlines caregiver strain and states that she will be placed in long-term care as she is not able to provide 24 hour care for her mother. She endorses a very poor appetite although the patients weight is remaining stable.   3. Follow up Palliative Care Visit: Palliative care will continue to follow for goals of care clarification and symptom management. Return 1-2 weeks or prn.  4. Family /Caregiver/Community Supports: In LtC now after living at home.  5. Cognitive / Functional decline: A and O x 1-2. Able to converse but family reports delirium. Becoming less able to do adls, needs almost total assist due to weakness and deconditioning.  I spent 45 minutes providing this  consultation,  from 1600 to 1645. More than 50% of the time in this consultation was spent coordinating communication.   CHIEF COMPLAINT: debility, confusion  HISTORY OF PRESENT ILLNESS:  Erica Warren is a 86 y.o. year old female  with h/o cva, recent increasing debility, oxygen dependence,  Encephalopathy, CKD 3, with decreasing ability for self care.  She has been placed in LTC due to this debility and family would like an assessment for  disposition including hospice services. We are asked to consult around disease progression and goals of care.   Palliative Care was asked to follow this patient by consultation request of Slade-Hartman, Ivette Loyal* to help address advance care planning and goals of care. This is the initial  visit.  CODE STATUS: DNR, comfort measures  PPS: 30%  HOSPICE ELIGIBILITY/DIAGNOSIS: TBD  ROS  General: NAD EYES: denies vision changes ENMT: denies dysphagia Cardiovascular: denies chest pain Pulmonary: denies  cough, endorses  increased SOB,  Abdomen: endorses fair appetite, endorses occ constipation, endorses incontinence of bowel GU: denies dysuria, endorses incontinence of urine MSK:  endorses ROM limitations, no falls reported Skin: denies rashes or wounds, endorses sacral tenderness Neurological: endorses weakness, endorses  pain, denies insomnia Psych: Endorses anxious mood Heme/lymph/immuno: denies bruises, abnormal bleeding  Physical Exam:  Current and past weights:206-204 lbs, bmi 33.2 Constitutional: 132/80 HR 78 RR 22,  General :frail appearing,Obese  EYES: anicteric sclera,lids intact, no discharge  ENMT: intact hearing,oral mucous membranes moist, dentition intact CV: S1S2, RRR, no LE edema Pulmonary: LCTA, no increased work of breathing, no cough, no audible wheezes, oxygen  At 2 L / [place] 90% pulse ox. Abdomen: intake 50%, normo-active BS +  4 quadrants, soft and non tender, no ascites GU: deferred MSK: mild sacropenia, decreased ROM in all extremities, no contractures of LE, non ambulatory Skin: warm and dry, no rashes or wounds on visible skin Neuro: Weakness, mild to mod cognitive impairment, grossly non -focal Psych: anxious affect, A and O x 2  CURRENT PROBLEM LIST:  Patient Active Problem List   Diagnosis Date Noted   Weakness 11/06/2019   Acute lower UTI 70/03/7492   Acute metabolic encephalopathy 49/67/5916   CKD (chronic kidney disease) stage 3, GFR 30-59  ml/min (HCC) 11/06/2019   Bradycardia 10/31/2017   Atrial fibrillation with RVR ([facility]) 09/20/2017   Essential hypertension, benign 09/20/2017   Chronic gout of multiple sites 09/20/2017   Dyslipidemia 09/20/2017   Chronic constipation 09/20/2017   Chronic ischemic left MCA stroke 09/20/2017   Carotid artery disease ([facility]) 09/02/2017   Closed fracture of lumbar spine without spinal cord lesion ([facility]) 08/31/2017   Aortic atherosclerosis ([facility]) 02/04/2016   PAST MEDICAL HISTORY:  Past Medical History:  Diagnosis Date   Arthritis    Atrial fibrillation ([facility])    unspecified   Carotid stenosis    nonhemodynamic   Carpal tunnel syndrome on right    Chronic kidney disease    Diabetes mellitus type 2, uncomplicated (HCC)    Essential tremor    Glaucoma    Gout    Gout    Hyperlipidemia    Hypertension    Obesity    unspecified   Stroke ([facility])     SOCIAL HX:  Social History   Tobacco Use   Smoking status: Never Smoker   Smokeless tobacco: Never Used  Substance Use Topics   Alcohol use: Not Currently   FAMILY HX:  Family History  Problem Relation Age of Onset   Heart failure Mother  Hypertension Mother    Stroke Father    Hypertension Brother     ALLERGIES: No Known Allergies   PERTINENT MEDICATIONS:  Outpatient Encounter Medications as of 12/21/2019  Medication Sig   acetaminophen (TYLENOL) 325 MG tablet Take 650 mg by mouth every 4 (four) hours as needed. for pain/ increased temp. May be administered orally, per G-tube if needed or rectally if unable to swallow (separate order). Maximum dose for 24 hours is 3,000 mg from all sources of Acetaminophen/ Tylenol   allopurinol (ZYLOPRIM) 100 MG tablet Take 100 mg by mouth 2 (two) times daily.   Amino Acids-Protein Hydrolys (FEEDING SUPPLEMENT, PRO-STAT SUGAR FREE 64,) LIQD Take 30 mLs by mouth 2 (two) times daily.   atorvastatin (LIPITOR) 40 MG tablet Take 40 mg by mouth daily.   bisacodyl  (DULCOLAX) 5 MG EC tablet Take 5 mg by mouth daily as needed for moderate constipation.   gabapentin (NEURONTIN) 100 MG capsule Take 100 mg by mouth 3 (three) times daily.   Metoprolol Tartrate 75 MG TABS Take 1 tablet by mouth 2 (two) times daily. Hold for systolic BP less than 553, or pulse less than 60   Nutritional Supplements (ENSURE ENLIVE PO) Take 1 Bottle by mouth 2 (two) times daily.   ondansetron (ZOFRAN) 4 MG tablet Take 4 mg by mouth every 6 (six) hours as needed for nausea or vomiting.   polyethylene glycol (MIRALAX / GLYCOLAX) packet Take 17 g by mouth daily.   sertraline (ZOLOFT) 50 MG tablet Take 50 mg by mouth daily.   warfarin (COUMADIN) 3 MG tablet Take 3 mg by mouth daily at 4 PM.    [DISCONTINUED] amLODipine (NORVASC) 2.5 MG tablet Hold until outpatient followup since blood pressure has been normal in the hospital without this.   [DISCONTINUED] docusate sodium (COLACE) 100 MG capsule Take 100 mg by mouth daily.   [DISCONTINUED] losartan-hydrochlorothiazide (HYZAAR) 100-25 MG tablet Hold until outpatient followup since blood pressure has been normal in the hospital without this.   No facility-administered encounter medications on file as of 12/21/2019.     Jason Coop, NP , DNP, MPH, AGPCNP-BC, ACHPN  COVID-19 PATIENT SCREENING TOOL  Person answering questions: ____________staff______ _____   1.  Is the patient or any family member in the home showing any signs or symptoms regarding respiratory infection?               Person with Symptom- __________NA_________________  a. Fever                                                                          Yes___ No___          ___________________  b. Shortness of breath                                                    Yes___ No___          ___________________ c. Cough/congestion  Yes___  No___         ___________________ d. Body aches/pains                                                          Yes___ No___        ____________________ e. Gastrointestinal symptoms (diarrhea, nausea)           Yes___ No___        ____________________  2. Within the past 14 days, has anyone living in the home had any contact with someone with or under investigation for COVID-19?    Yes___ No_X_   Person __________________

## 2019-12-30 ENCOUNTER — Emergency Department: Payer: Medicare Other

## 2019-12-30 ENCOUNTER — Other Ambulatory Visit: Payer: Self-pay

## 2019-12-30 ENCOUNTER — Emergency Department
Admission: EM | Admit: 2019-12-30 | Discharge: 2019-12-30 | Disposition: A | Discharge status: Skilled Nursing Facility | Payer: Medicare Other | Attending: Emergency Medicine | Admitting: Emergency Medicine

## 2019-12-30 DIAGNOSIS — Z20822 Contact with and (suspected) exposure to covid-19: Secondary | ICD-10-CM | POA: Insufficient documentation

## 2019-12-30 DIAGNOSIS — Z79899 Other long term (current) drug therapy: Secondary | ICD-10-CM | POA: Insufficient documentation

## 2019-12-30 DIAGNOSIS — I129 Hypertensive chronic kidney disease with stage 1 through stage 4 chronic kidney disease, or unspecified chronic kidney disease: Secondary | ICD-10-CM | POA: Insufficient documentation

## 2019-12-30 DIAGNOSIS — N183 Chronic kidney disease, stage 3 unspecified: Secondary | ICD-10-CM | POA: Diagnosis not present

## 2019-12-30 DIAGNOSIS — Z7901 Long term (current) use of anticoagulants: Secondary | ICD-10-CM | POA: Diagnosis not present

## 2019-12-30 DIAGNOSIS — I2510 Atherosclerotic heart disease of native coronary artery without angina pectoris: Secondary | ICD-10-CM | POA: Insufficient documentation

## 2019-12-30 DIAGNOSIS — R0602 Shortness of breath: Secondary | ICD-10-CM | POA: Diagnosis present

## 2019-12-30 DIAGNOSIS — E1122 Type 2 diabetes mellitus with diabetic chronic kidney disease: Secondary | ICD-10-CM | POA: Insufficient documentation

## 2019-12-30 DIAGNOSIS — J189 Pneumonia, unspecified organism: Secondary | ICD-10-CM

## 2019-12-30 DIAGNOSIS — J168 Pneumonia due to other specified infectious organisms: Secondary | ICD-10-CM | POA: Diagnosis not present

## 2019-12-30 DIAGNOSIS — R791 Abnormal coagulation profile: Secondary | ICD-10-CM

## 2019-12-30 LAB — COMPREHENSIVE METABOLIC PANEL
ALT: 35 U/L (ref 0–44)
AST: 28 U/L (ref 15–41)
Albumin: 3.2 g/dL — ABNORMAL LOW (ref 3.5–5.0)
Alkaline Phosphatase: 73 U/L (ref 38–126)
Anion gap: 9 (ref 5–15)
BUN: 33 mg/dL — ABNORMAL HIGH (ref 8–23)
CO2: 30 mmol/L (ref 22–32)
Calcium: 9 mg/dL (ref 8.9–10.3)
Chloride: 105 mmol/L (ref 98–111)
Creatinine, Ser: 1.34 mg/dL — ABNORMAL HIGH (ref 0.44–1.00)
GFR, Estimated: 38 mL/min — ABNORMAL LOW (ref >60)
Glucose, Bld: 106 mg/dL — ABNORMAL HIGH (ref 70–99)
Potassium: 4.3 mmol/L (ref 3.5–5.1)
Sodium: 144 mmol/L (ref 135–145)
Total Bilirubin: 1.2 mg/dL (ref 0.3–1.2)
Total Protein: 5.9 g/dL — ABNORMAL LOW (ref 6.5–8.1)

## 2019-12-30 LAB — CBC WITH DIFFERENTIAL/PLATELET
Abs Immature Granulocytes: 0.02 10*3/uL (ref 0.00–0.07)
Basophils Absolute: 0 10*3/uL (ref 0.0–0.1)
Basophils Relative: 0 %
Eosinophils Absolute: 0 10*3/uL (ref 0.0–0.5)
Eosinophils Relative: 0 %
HCT: 43.6 % (ref 36.0–46.0)
Hemoglobin: 14.1 g/dL (ref 12.0–15.0)
Immature Granulocytes: 0 %
Lymphocytes Relative: 12 %
Lymphs Abs: 0.7 10*3/uL (ref 0.7–4.0)
MCH: 32.6 pg (ref 26.0–34.0)
MCHC: 32.3 g/dL (ref 30.0–36.0)
MCV: 100.7 fL — ABNORMAL HIGH (ref 80.0–100.0)
Monocytes Absolute: 0.4 10*3/uL (ref 0.1–1.0)
Monocytes Relative: 7 %
Neutro Abs: 5 10*3/uL (ref 1.7–7.7)
Neutrophils Relative %: 81 %
Platelets: 168 10*3/uL (ref 150–400)
RBC: 4.33 MIL/uL (ref 3.87–5.11)
RDW: 16.3 % — ABNORMAL HIGH (ref 11.5–15.5)
WBC: 6.1 10*3/uL (ref 4.0–10.5)
nRBC: 0 % (ref 0.0–0.2)

## 2019-12-30 LAB — TROPONIN I (HIGH SENSITIVITY)
Troponin I (High Sensitivity): 25 ng/L — ABNORMAL HIGH (ref <18)
Troponin I (High Sensitivity): 25 ng/L — ABNORMAL HIGH (ref <18)

## 2019-12-30 LAB — RESP PANEL BY RT PCR (RSV, FLU A&B, COVID)
Influenza A by PCR: NEGATIVE
Influenza B by PCR: NEGATIVE
Respiratory Syncytial Virus by PCR: NEGATIVE
SARS Coronavirus 2 by RT PCR: NEGATIVE

## 2019-12-30 LAB — PROTIME-INR
INR: 4.4 (ref 0.8–1.2)
Prothrombin Time: 40.6 seconds — ABNORMAL HIGH (ref 11.4–15.2)

## 2019-12-30 LAB — PROCALCITONIN: Procalcitonin: <0.1 ng/mL

## 2019-12-30 MED ORDER — METOPROLOL TARTRATE 50 MG PO TABS
75.0000 mg | ORAL_TABLET | Freq: Once | ORAL | Status: AC
  Administered 2019-12-30: 75 mg via ORAL
  Filled 2019-12-30: qty 1

## 2019-12-30 NOTE — ED Notes (Addendum)
EMS transporting to [facility].  Report was not able to be called as there was no answer "on that hallway." Spoke with Engineer, structural and gave her my ascom number so they could call me. Also sent message with paramedics who were transporting that they could call me at provided number.

## 2019-12-30 NOTE — ED Notes (Signed)
EMS at bedside to transport pt back to Nursing home

## 2019-12-30 NOTE — ED Notes (Signed)
Erica Warren from [facility] called to get report. Report given and answered questions.

## 2019-12-30 NOTE — ED Notes (Signed)
Date and time results received: 12/30/19 0440  Test: INR Critical Value: 4.4  Name of Provider Notified: Dr. Don Perking   Orders Received? Or Actions Taken?: Provider read back.

## 2019-12-30 NOTE — Discharge Instructions (Addendum)
Hold coumadin until INR normalizes. Check INR in 24 hours. When INR is less then 3, restart coumadin. Levaquin will altered coumadin levels therefore it is important to check INR daily while on levaquin. Continue treatment with antibiotics initiated at the facility.  Return to the emergency room for difficulty breathing.

## 2019-12-30 NOTE — ED Notes (Signed)
Patient asleep this morning and is resting well. Patient to discharge back to [facility] and transport via EMS.

## 2019-12-30 NOTE — ED Provider Notes (Signed)
[facility] Emergency Department Provider Note  ____________________________________________  Time seen: Approximately 4:07 AM  I have reviewed the triage vital signs and the nursing notes.   HISTORY  Chief Complaint Shortness of Breath  Level 5 caveat:  Portions of the history and physical were unable to be obtained due to dementia   HPI Erica Warren is a 86 y.o. female with a history of A. fib on Coumadin, diabetes, chronic kidney disease, hypertension, hyperlipidemia, recently diagnosed pneumonia on Levaquin who presents from her nursing home for being combative.  According to EMS, nursing home staff was trying to administer an albuterol treatment when patient became agitated screaming that she was being poisoned which prompted him to call 911.   According to EMS, albuterol was being administered because patient seemed short of breath.  Patient has been on oxygen since diagnosed with pneumonia couple days ago.  She has no history of COPD or asthma.  Patient is pleasant and cooperative at this time.  She does have a history of dementia.  She denies having any shortness of breath, nausea, abdominal pain, or chest pain.  Past Medical History:  Diagnosis Date  . Arthritis   . Atrial fibrillation (HCC)    unspecified  . Carotid stenosis    nonhemodynamic  . Carpal tunnel syndrome on right   . Chronic kidney disease   . Diabetes mellitus type 2, uncomplicated (HCC)   . Essential tremor   . Glaucoma   . Gout   . Gout   . Hyperlipidemia   . Hypertension   . Obesity    unspecified  . Stroke [facility])     Patient Active Problem List   Diagnosis Date Noted  . Weakness 11/06/2019  . Acute lower UTI 11/06/2019  . Acute metabolic encephalopathy 11/06/2019  . CKD (chronic kidney disease) stage 3, GFR 30-59 ml/min (HCC) 11/06/2019  . Bradycardia 10/31/2017  . Atrial fibrillation with RVR (HCC) 09/20/2017  . Essential hypertension, benign 09/20/2017  .  Chronic gout of multiple sites 09/20/2017  . Dyslipidemia 09/20/2017  . Chronic constipation 09/20/2017  . Chronic ischemic left MCA stroke 09/20/2017  . Carotid artery disease (HCC) 09/02/2017  . Closed fracture of lumbar spine without spinal cord lesion (HCC) 08/31/2017  . Aortic atherosclerosis (HCC) 02/04/2016    Past Surgical History:  Procedure Laterality Date  . ABDOMINAL HYSTERECTOMY  1982  . CATARACT EXTRACTION EXTRACAPSULAR Right 2006  . CATARACT EXTRACTION EXTRACAPSULAR Left 2004  . CHOLECYSTECTOMY  1986    Prior to Admission medications   Medication Sig Start Date End Date Taking? Authorizing Provider  acetaminophen (TYLENOL) 325 MG tablet Take 650 mg by mouth every 4 (four) hours as needed. for pain/ increased temp. May be administered orally, per G-tube if needed or rectally if unable to swallow (separate order). Maximum dose for 24 hours is 3,000 mg from all sources of Acetaminophen/ Tylenol   Yes [provider]  allopurinol (ZYLOPRIM) 100 MG tablet Take 100 mg by mouth 2 (two) times daily.   Yes [provider]  Amino Acids-Protein Hydrolys (FEEDING SUPPLEMENT, PRO-STAT SUGAR FREE 64,) LIQD Take 30 mLs by mouth 2 (two) times daily.   Yes [provider]  atorvastatin (LIPITOR) 40 MG tablet Take 40 mg by mouth daily.   Yes [provider]  bisacodyl (DULCOLAX) 5 MG EC tablet Take 5 mg by mouth daily as needed for moderate constipation.   Yes [provider]  gabapentin (NEURONTIN) 100 MG capsule Take 100  mg by mouth 3 (three) times daily.   Yes [provider]  Metoprolol Tartrate 75 MG TABS Take 1 tablet by mouth 2 (two) times daily. Hold for systolic BP less than 110, or pulse less than 60   Yes [provider]  Nutritional Supplements (ENSURE ENLIVE PO) Take 1 Bottle by mouth 2 (two) times daily.   Yes [provider]  ondansetron (ZOFRAN) 4 MG tablet Take 4 mg by mouth every 6 (six) hours as needed  for nausea or vomiting.   Yes [provider]  polyethylene glycol (MIRALAX / GLYCOLAX) packet Take 17 g by mouth daily.   Yes [provider]  sertraline (ZOLOFT) 50 MG tablet Take 50 mg by mouth daily.   Yes [provider]  warfarin (COUMADIN) 3 MG tablet Take 3 mg by mouth daily at 4 PM.  10/25/19  Yes [provider]    Allergies Patient has no known allergies.  Family History  Problem Relation Age of Onset  . Heart failure Mother   . Hypertension Mother   . Stroke Father   . Hypertension Brother     Social History Social History   Tobacco Use  . Smoking status: Never Smoker  . Smokeless tobacco: Never Used  Vaping Use  . Vaping Use: Never used  Substance Use Topics  . Alcohol use: Not Currently  . Drug use: Never    Review of Systems  Constitutional: Negative for fever. Eyes: Negative for visual changes. ENT: Negative for sore throat. Neck: No neck pain  Cardiovascular: Negative for chest pain. Respiratory: + shortness of breath. Gastrointestinal: Negative for abdominal pain, vomiting or diarrhea. Genitourinary: Negative for dysuria. Musculoskeletal: Negative for back pain. Skin: Negative for rash. Neurological: Negative for headaches, weakness or numbness. Psych: No SI or HI  ____________________________________________   PHYSICAL EXAM:  VITAL SIGNS: ED Triage Vitals  Enc Vitals Group     BP 12/30/19 0316 135/87     Pulse Rate 12/30/19 0316 (!) 117     Resp 12/30/19 0316 (!) 26     Temp 12/30/19 0316 97.6 F (36.4 C)     Temp Source 12/30/19 0316 Oral     SpO2 12/30/19 0316 98 %     Weight 12/30/19 0317 205 lb (93 kg)     Height 12/30/19 0317 5\' 5"  (1.651 m)     Head Circumference --      Peak Flow --      Pain Score 12/30/19 0317 0     Pain Loc --      Pain Edu? --      Excl. in GC? --     Constitutional: Alert and oriented x 2. Well appearing and in no apparent distress. HEENT:      Head: Normocephalic  and atraumatic.         Eyes: Conjunctivae are normal. Sclera is non-icteric.       Mouth/Throat: Mucous membranes are moist.       Neck: Supple with no signs of meningismus. Cardiovascular: Irregularly irregular rhythm with tachycardic rate Respiratory: Tachypneic with clear lungs, no crackles or wheezing, satting well on 2 L nasal cannula  gastrointestinal: Soft, non tender, and non distended. Musculoskeletal: No edema, cyanosis, or erythema of extremities. Neurologic: Normal speech and language. Face is symmetric. Moving all extremities. No gross focal neurologic deficits are appreciated. Skin: Skin is warm, dry and intact. No rash noted. Psychiatric: Mood and affect are normal. Speech and behavior are normal.  ____________________________________________  LABS (all labs ordered are listed, but only abnormal results are displayed)  Labs Reviewed  CBC WITH DIFFERENTIAL/PLATELET - Abnormal; Notable for the following components:      Result Value   MCV 100.7 (*)    RDW 16.3 (*)    All other components within normal limits  PROTIME-INR - Abnormal; Notable for the following components:   Prothrombin Time 40.6 (*)    INR 4.4 (*)    All other components within normal limits  COMPREHENSIVE METABOLIC PANEL - Abnormal; Notable for the following components:   Glucose, Bld 106 (*)    BUN 33 (*)    Creatinine, Ser 1.34 (*)    Total Protein 5.9 (*)    Albumin 3.2 (*)    GFR, Estimated 38 (*)    All other components within normal limits  TROPONIN I (HIGH SENSITIVITY) - Abnormal; Notable for the following components:   Troponin I (High Sensitivity) 25 (*)    All other components within normal limits  TROPONIN I (HIGH SENSITIVITY) - Abnormal; Notable for the following components:   Troponin I (High Sensitivity) 25 (*)    All other components within normal limits  RESP PANEL BY RT PCR (RSV, FLU A&B, COVID)  PROCALCITONIN   ____________________________________________  EKG  ED ECG  REPORT I, Nita Sicklearolina Reagan Behlke, the attending physician, personally viewed and interpreted this ECG.  Atrial fibrillation, rate of 116, normal QTC, normal axis, mild ST depressions inferiorly with no ST elevation.  Depressions are slightly worse when compared to prior from September 2021 ____________________________________________  RADIOLOGY  I have personally reviewed the images performed during this visit and I agree with the Radiologist's read.   Interpretation by Radiologist:  CT Chest Wo Contrast  Result Date: 12/30/2019 CLINICAL DATA:  Shortness of breath, dementia. EXAM: CT CHEST WITHOUT CONTRAST TECHNIQUE: Multidetector CT imaging of the chest was performed following the standard protocol without IV contrast. COMPARISON:  Chest CT dated 06/29/2006. FINDINGS: Cardiovascular: Cardiomegaly. No pericardial effusion. Aortic atherosclerosis. No thoracic aortic aneurysm. Diffuse coronary artery calcifications. Mediastinum/Nodes: Redemonstration of large RIGHT thyroid goiter. No new mass or enlarged lymph nodes within the mediastinum. Esophagus is unremarkable. Trachea is unremarkable. Lungs/Pleura: Patchy ground-glass opacities within each lung, LEFT greater than RIGHT. Probable additional atelectasis within the LEFT lower lobe. Moderate-sized RIGHT pleural effusion with associated compressive atelectasis. Upper Abdomen: No acute findings on limited views of the upper abdomen. Musculoskeletal: Degenerative spondylosis of the thoracic spine, mild to moderate in degree. No acute appearing osseous abnormality. IMPRESSION: 1. Patchy ground-glass opacities within each lung, LEFT greater than RIGHT, most likely multifocal pneumonia. Differential includes atypical pneumonias such as viral or fungal, interstitial pneumonias, edema related to volume overload/CHF, chronic interstitial diseases, hypersensitivity pneumonitis, and respiratory bronchiolitis. 2. Moderate-sized RIGHT pleural effusion with associated  compressive atelectasis. 3. Cardiomegaly. 4. Diffuse coronary artery calcifications. Aortic Atherosclerosis (ICD10-I70.0). Electronically Signed   By: Bary RichardStan  Maynard M.D.   On: 12/30/2019 05:26   DG Chest Portable 1 View  Result Date: 12/30/2019 CLINICAL DATA:  Shortness of breath. EXAM: PORTABLE CHEST 1 VIEW COMPARISON:  11/06/2019 FINDINGS: Shallow inspiration with elevation of right hemidiaphragm. Atelectasis in the lung bases. Probable small left pleural effusion. Cardiac enlargement. Calcification of the aorta. Right paratracheal soft tissue with tracheal displacement towards the left consistent with thyroid goiter. Degenerative changes in the spine and shoulders. Surgical clips in the right upper quadrant. IMPRESSION: Shallow inspiration with atelectasis in the lung bases. Probable small left pleural effusion. Cardiac enlargement. Electronically Signed   By:  Burman Nieves M.D.   On: 12/30/2019 03:51     ____________________________________________   PROCEDURES  Procedure(s) performed:yes .1-3 Lead EKG Interpretation Performed by: Nita Sickle, MD Authorized by: Nita Sickle, MD     Interpretation: abnormal     ECG rate assessment: tachycardic     Rhythm: atrial fibrillation     Ectopy: none     Critical Care performed:  None ____________________________________________   INITIAL IMPRESSION / ASSESSMENT AND PLAN / ED COURSE  86 y.o. female with a history of A. fib on Coumadin, diabetes, chronic kidney disease, hypertension, hyperlipidemia, recently diagnosed pneumonia on Levaquin who presents from her nursing home for being combative when albuterol was being administered to her. Most likely dementia +/- steroids +/- sepsis. Patient has no history of COPD or asthma but has been receiving prednisone, Levaquin, and albuterol treatments since being diagnosed with pneumonia couple days ago.  Patient is otherwise well-appearing at this time, pleasant and cooperative, alert  and oriented x2, she denies any shortness of breath.  She is on 2 L oxygen which she has been for the last few days as well.  Her lungs are clear with no wheezing or crackles.  She has a history of A. fib and she is currently in A. fib with a ventricular rate of 116.  Chest x-ray visualized by me with poor inspiratory effort therefore limited interpretation but no lobar pneumonia, confirmed by radiology.  Unknown if patient was tested for Covid at SNF.  We will check basic labs for any signs of dehydration, electrolyte derangements, sepsis, demand ischemia.  Patient placed on telemetry for close monitoring.  Medical records from the nursing home reviewed which showed patient on Levaquin and prednisone.  _________________________ 7:06 AM on 12/30/2019 ----------------------------------------- Initial troponin slightly elevated however repeat is unchanged at 25. Patient remains with normal work of breathing, and sating well on O2 started at [facility]. No wheezing, no hypoxia. She was given her morning dose of metoprolol to help control her A. fib at this time. Labs show no leukocytosis, no significant electrolyte derangements when compared to baseline. INR is supratherapeutic at 4.4 but no active bleeding. Most likely because patient is on Levaquin. Will recommend holding warfarin until INR normalizes and have daily checks of INR while on Levaquin. CT of her chest was visualized by me showing bilateral ground glass opacities. Covid, RSV and flu are pending. If Covid is positive we'll plan to admit patient since she has new oxygen requirement for remdesivir and steroids. If Covid is negative plan to discharge back to the nursing home with continuing Levaquin, supplemental oxygen, and close follow-up with her primary care doctor. Plan discussed with Dr. Cyril Loosen who accepted patient's care at [facility].     _____________________________________________ Please note:  Patient was evaluated in Emergency Department today for the  symptoms described in the history of present illness. Patient was evaluated in the context of the global COVID-19 pandemic, which necessitated consideration that the patient might be at risk for infection with the SARS-CoV-2 virus that causes COVID-19. Institutional protocols and algorithms that pertain to the evaluation of patients at risk for COVID-19 are in a state of rapid change based on information released by regulatory bodies including the CDC and federal and state organizations. These policies and algorithms were followed during the patient's care in the ED.  Some ED evaluations and interventions may be delayed as a result of limited staffing during the pandemic.   [place] Controlled Substance Database was reviewed by me. ____________________________________________  FINAL CLINICAL IMPRESSION(S) / ED DIAGNOSES   Final diagnoses:  Pneumonia due to infectious organism, unspecified laterality, unspecified part of lung  Supratherapeutic INR      NEW MEDICATIONS STARTED DURING THIS VISIT:  ED Discharge Orders    None       Note:  This document was prepared using Dragon voice recognition software and may include unintentional dictation errors.    Don Perking, [place], MD 12/30/19 [phone]

## 2019-12-30 NOTE — ED Triage Notes (Signed)
Pt presents to ER from [facility] with SOB.  Per ems, staff at SNF attempted giving nebulizer to pt, and pt thought she was being "poisoned" and became combative.  Pt A&O x3 at this time. Pt does have hx of some dementia.  Pt given 125 solumedrol w/ems.

## 2020-01-03 ENCOUNTER — Non-Acute Institutional Stay: Payer: Medicare Other | Admitting: Primary Care

## 2020-01-03 ENCOUNTER — Other Ambulatory Visit: Payer: Self-pay

## 2020-01-03 DIAGNOSIS — I4891 Unspecified atrial fibrillation: Secondary | ICD-10-CM

## 2020-01-03 DIAGNOSIS — Z515 Encounter for palliative care: Secondary | ICD-10-CM

## 2020-01-03 DIAGNOSIS — Z8673 Personal history of transient ischemic attack (TIA), and cerebral infarction without residual deficits: Secondary | ICD-10-CM

## 2020-01-03 DIAGNOSIS — I6930 Unspecified sequelae of cerebral infarction: Secondary | ICD-10-CM

## 2020-01-03 DIAGNOSIS — R531 Weakness: Secondary | ICD-10-CM

## 2020-01-03 NOTE — Progress Notes (Signed)
[facility] Consult Note Telephone: [phone]  Fax: [phone]  PATIENT NAME: Erica Warren [id number] [phone] (home)  DOB: 03-08-1931 MRN: [id number]  PRIMARY CARE PROVIDER:    Alvester Morin, MD,  [place]. Erica Warren [phone]  REFERRING PROVIDER:   Alvester Morin, MD [place]. [place],  Erica Warren [phone]  RESPONSIBLE PARTY:   Extended Emergency Contact Information Primary Emergency Contact: Erica Warren Address: [place], [place] [address] Johnnette Litter of [place] Phone: [phone] Relation: Daughter Secondary Emergency Contact: Erica Warren Address: [place], [place] [address] Johnnette Litter of [place] Phone: [phone] Relation: Granddaughter  I met face to face with patient in the facility.   ASSESSMENT AND RECOMMENDATIONS:   1. Advance Care Planning/Goals of Care: Goals include to maximize quality of life and symptom management. Our advance care planning conversation included a discussion about:     Exploration of personal, cultural or spiritual beliefs that might influence medical decisions   Exploration of goals of care in the event of a sudden injury or illness   Review and updating or creation of an  advance directive document .  Decision to de-escalate disease focused treatments due to poor prognosis.  I discussed goals of care with her daughter and POA, by phone. POA endorses some strained relationship but as willing to provide anything her mother may need. We'd discussed possible hospice  admission previously, but I think based on today's visit and some more assessment that she is not eligible yet. DNR directive reviewed.  2. Symptom Management:   I met with [name] in her nursing home room. Today she was more interactive than her last visit.  She endorses unhappiness with her recent change in liquids to thickened. She did have some water on her tray which she accidentally spilled on herself and this was reported to the staff. She states her appetite is poor but appears to be maintaining her weight and staff date her intake is around 50%. She does enjoy outside food that friends and visitors bring in.   Altered mental status last week, went to ED with hallucinations about people trying to kill her granddaughter. She was combative and asked daughter to call police. ED did not not run U/s but had PNA dx. On steroids and levaquin with good response. Not dypsnic and lungs moving air well with occasional wheezes.  We discussed her moods and does not like interactions with people She's had many hallucinations and sundowning. She is seeing people who have passed on. Daughter is willing to try a medicine to see if this may help the episodes. I recommend 25 mg seroquel at hs to begin.  She should be encouraged to be out of bed and as active as possible. Staff stated that sometimes she's able to be gotten up with the sit to stand lift and hopefully into a wheelchair for some activities.  3. Follow up Palliative Care Visit: Palliative care will continue to follow for goals of care clarification and symptom management. Return 4 weeks or prn.  4. Family /Caregiver/Community Supports: Daughter is poa and outlines family strain. They provide supplies but also endorses baseline personality is negative. She is concerned patient is refusing help and visits due to his personality trait. She is pursuing LTC placement and  medicaid funding.  5. Cognitive / Functional decline: A and O x 1-2, has had episode of AMS and went to ED. Moderate cognitive impairment.  I spent 60 minutes providing this consultation,  from 1000 to 1100. More than 50% of the time in this consultation was spent coordinating communication.   CHIEF COMPLAINT: AMS, dysphagia, advance care   planning  HISTORY OF PRESENT ILLNESS:  OLETHA TOLSON is a 86 y.o. year old female  with AMS, dysphagia, clarification of goals of care. We are asked to consult around goals of care, advance care planning.    Review and summarization of old Epic records shows or history from other than patient  shows recent ED trip due to [place]. Review or lab tests, radiology,  or medicine GFR =  38 , Albumin 2.3 Review of case with family member Daughter Shirlean Mylar who states they have a strained relationship but she talks to her frequently and sends in her supplies.  Palliative Care was asked to follow this patient by consultation request of Slade-Hartman, Ivette Loyal* to help address advance care planning and goals of care. This is a follow up visit.  CODE STATUS: DNR  PPS: 40%  HOSPICE ELIGIBILITY/DIAGNOSIS: no  ROS/staff  General: NAD EYES: denies vision changes, wears glasses ENMT: denies dysphagia Cardiovascular: denies chest pain Pulmonary: denies  cough, denies increased SOB,  Abdomen: endorses fair appetite, endorses constipation, endorses continence of bowel GU: denies dysuria, endorses continence of urine MSK:  endorses ROM limitations, no falls reported Skin: denies rashes or wounds Neurological: endorses weakness, denies pain, denies insomnia Psych: Endorses negative  Mood, had ED trip for combativeness, some sun downing   Physical Exam: Current and past weights: 205 lbs Constitutional:  NAD General: frail appearing, Obese  EYES: anicteric sclera,EOMI, lids intact, no discharge  ENMT: intact hearing,oral mucous membranes moist, dentition intact CV: S1S2, RRR, no LE edema Pulmonary: wheezes in bases no increased work of breathing, no cough, no audible wheezes, oxygen prn Abdomen: intake 50%, no ascites GU: deferred MSK: mild sacropenia, decreased ROM in all extremities, non ambulatory, pivots to transfer Skin: warm and dry, no rashes or wounds on visible skin Neuro: + Weakness, moderate  cognitive impairment, grossly non -focal Psych: slight anxious affect, A and O x 2 Heme: No bruises or bleeding noted  CURRENT PROBLEM LIST:  Patient Active Problem List   Diagnosis Date Noted  . Weakness 11/06/2019  . Acute lower UTI 11/06/2019  . Acute metabolic encephalopathy 66/29/4765  . CKD (chronic kidney disease) stage 3, GFR 30-59 ml/min (HCC) 11/06/2019  . Bradycardia 10/31/2017  . Atrial fibrillation with RVR ([place]) 09/20/2017  . Essential hypertension, benign 09/20/2017  . Chronic gout of multiple sites 09/20/2017  . Dyslipidemia 09/20/2017  . Chronic constipation 09/20/2017  . Chronic ischemic left MCA stroke 09/20/2017  . Carotid artery disease ([place]) 09/02/2017  . Closed fracture of lumbar spine without spinal cord lesion ([place]) 08/31/2017  . Aortic atherosclerosis ([place]) 02/04/2016   PAST MEDICAL HISTORY:  Past Medical History:  Diagnosis Date  . Arthritis   . Atrial fibrillation ([place])    unspecified  . Carotid stenosis    nonhemodynamic  . Carpal tunnel syndrome on right   . Chronic kidney disease   . Diabetes mellitus type 2, uncomplicated ([place])   . Essential tremor   . Glaucoma   . Gout   . Gout   . Hyperlipidemia   . Hypertension   . Obesity    unspecified  . Stroke [facility])  SOCIAL HX:  Social History   Tobacco Use  . Smoking status: Never Smoker  . Smokeless tobacco: Never Used  Substance Use Topics  . Alcohol use: Not Currently   FAMILY HX:  Family History  Problem Relation Age of Onset  . Heart failure Mother   . Hypertension Mother   . Stroke Father   . Hypertension Brother     ALLERGIES: No Known Allergies   PERTINENT MEDICATIONS:  Outpatient Encounter Medications as of 01/03/2020  Medication Sig  . acetaminophen (TYLENOL) 325 MG tablet Take 650 mg by mouth every 4 (four) hours as needed. for pain/ increased temp. May be administered orally, per G-tube if needed or rectally if unable to swallow (separate order). Maximum dose for  24 hours is 3,000 mg from all sources of Acetaminophen/ Tylenol  . allopurinol (ZYLOPRIM) 100 MG tablet Take 100 mg by mouth 2 (two) times daily.  . Amino Acids-Protein Hydrolys (FEEDING SUPPLEMENT, PRO-STAT SUGAR FREE 64,) LIQD Take 30 mLs by mouth 2 (two) times daily.  Marland Kitchen atorvastatin (LIPITOR) 40 MG tablet Take 40 mg by mouth daily.  . bisacodyl (DULCOLAX) 5 MG EC tablet Take 5 mg by mouth daily as needed for moderate constipation.  . gabapentin (NEURONTIN) 100 MG capsule Take 100 mg by mouth 3 (three) times daily.  . Metoprolol Tartrate 75 MG TABS Take 1 tablet by mouth 2 (two) times daily. Hold for systolic BP less than 323, or pulse less than 60  . Nutritional Supplements (ENSURE ENLIVE PO) Take 1 Bottle by mouth 2 (two) times daily.  . ondansetron (ZOFRAN) 4 MG tablet Take 4 mg by mouth every 6 (six) hours as needed for nausea or vomiting.  . polyethylene glycol (MIRALAX / GLYCOLAX) packet Take 17 g by mouth daily.  . sertraline (ZOLOFT) 50 MG tablet Take 50 mg by mouth daily.  Marland Kitchen warfarin (COUMADIN) 3 MG tablet Take 3 mg by mouth daily at 4 PM.    No facility-administered encounter medications on file as of 01/03/2020.     Jason Coop, NP , DNP, MPH, AGPCNP-BC, ACHPN  COVID-19 PATIENT SCREENING TOOL  Person answering questions: ____________staff______ _____   1.  Is the patient or any family member in the home showing any signs or symptoms regarding respiratory infection?               Person with Symptom- __________NA_________________  a. Fever                                                                          Yes___ No___          ___________________  b. Shortness of breath                                                    Yes___ No___          ___________________ c. Cough/congestion  Yes___  No___         ___________________ d. Body aches/pains                                                         Yes___ No___         ____________________ e. Gastrointestinal symptoms (diarrhea, nausea)           Yes___ No___        ____________________  2. Within the past 14 days, has anyone living in the home had any contact with someone with or under investigation for COVID-19?    Yes___ No_X_   Person __________________

## 2020-01-04 ENCOUNTER — Non-Acute Institutional Stay: Payer: Medicare Other | Admitting: Primary Care

## 2020-01-10 ENCOUNTER — Encounter: Payer: Self-pay | Admitting: Primary Care

## 2020-01-10 ENCOUNTER — Other Ambulatory Visit: Payer: Self-pay

## 2020-01-10 ENCOUNTER — Non-Acute Institutional Stay: Payer: Medicare Other | Admitting: Primary Care

## 2020-01-10 DIAGNOSIS — I6930 Unspecified sequelae of cerebral infarction: Secondary | ICD-10-CM

## 2020-01-10 DIAGNOSIS — Z8673 Personal history of transient ischemic attack (TIA), and cerebral infarction without residual deficits: Secondary | ICD-10-CM

## 2020-01-10 DIAGNOSIS — R600 Localized edema: Secondary | ICD-10-CM

## 2020-01-10 DIAGNOSIS — I4891 Unspecified atrial fibrillation: Secondary | ICD-10-CM

## 2020-01-10 DIAGNOSIS — Z515 Encounter for palliative care: Secondary | ICD-10-CM

## 2020-01-10 DIAGNOSIS — N1832 Chronic kidney disease, stage 3b: Secondary | ICD-10-CM

## 2020-01-10 NOTE — Progress Notes (Signed)
[facility] Consult Note Telephone: [phone]  Fax: [phone]    Date of encounter: 01/10/20 PATIENT NAME: Erica Warren [place] [id number] (home)  DOB: [birth year] MRN: [id number]  PRIMARY CARE PROVIDER:    Alvester Morin, MD,  [place]. [place] [address] [phone]  REFERRING PROVIDER:   Alvester Morin, MD [place]. [place],  [place] [address] [phone]  RESPONSIBLE PARTY:   Extended Emergency Contact Information Primary Emergency Contact: Erica Warren Address: [place], [place] [address] Johnnette Litter of [place] Phone: [phone] Relation: Daughter Secondary Emergency Contact: Erica Warren Address: [place], [place] [address] Johnnette Litter of [place] Phone: [phone] Relation: Granddaughter  I met face to face with patient and family in facility. Palliative Care was asked to follow this patient by consultation request of Slade-Hartman, Erica Warren* to help address advance care planning and goals of care. This is a follow up  visit.   ASSESSMENT AND RECOMMENDATIONS:   1. Advance Care Planning/Goals of Care: Goals include to maximize quality of life and symptom management. Our advance care planning conversation included a discussion about:     The value and importance of advance care planning   Experiences with loved ones who have been seriously ill or have died   Exploration of personal, cultural or spiritual beliefs that might influence medical decisions   Exploration of goals of care in the event of a sudden injury or illness   Decision to de-escalate disease focused treatments due to poor prognosis.   I discussed end of life and hospice service with daughter Erica Warren. We have discussed this now over several weeks. Erica Warren now sees that her mother has continued to decline and  fears she is very close to the end. Patient has been stating someone has been coming in to help her pack so that she can go home. Of course this is not a person that anyone else has seen. We discussed the harbingers of end of life such as people asking to go home or seeing departed family members. Erica Warren now states that hospice at the snf  would be a good plan of care for her mother that she does not want to pursue any more treatments or diagnostics, and that she would very much like for the supportive care of the hospice philosophy. Dr Erica GlazierHenderson [place] has agreed and will attend.  2. Symptom Management:   I met with patient and family, Erica Warren in her room. Patient has been more and more confused and continue to show steady decline. She was able to get out of bed with a sit to stand lift about three weeks ago. Since that time she's become more and more bedbound. Today at her upper extremities are growing more edematous (2-3 +)  with right edema in her lower extremities (2+). Her intake is poor she is now just taking bites and sips of food and drink. This was initially attributed to her dislike for the  thickened liquid diet. However she is also refusing the milkshake her daughter has brought her from a local fast food restaurant. Weight is 205 lbs but confounded by increasing edema, likely from heart failure. She has not had a BM for several days and so I've asked the staff to give her a PRN bisacodyl She is complaining of  nausea and vomiting. I've asked them to also give her a PRN Zofran.    3. Follow up Palliative Care Visit: Refer to hospice    I spent 60 minutes providing this consultation,  from 1200 to 1300. More than 50% of the time in this consultation was spent coordinating communication.   CODE STATUS:DNR  PPS: 30%  HOSPICE ELIGIBILITY/DIAGNOSIS: yes/edema, heart failure  Subjective:  CHIEF COMPLAINT: debility and decline  HISTORY OF PRESENT ILLNESS:  Erica UNCAPHER is a 86 y.o. year  old female  with Old CVA, CKD stage 3, obesity, frequent aspiration. Has thickened diet but has continued to aspirate. Now her intake has declined to bites/sips. PPS is weak 30%. I recommend hospice admission for support of sx and family care goal .   We are asked to consult around advance planning, goals of care.    History obtained from review of EMR, discussion with primary team,  interview with family and/or Erica Warren. Records reviewed and summarized above.   CURRENT PROBLEM LIST:  Patient Active Problem List   Diagnosis Date Noted  . Weakness 11/06/2019  . Acute lower UTI 11/06/2019  . Acute metabolic encephalopathy 44/31/5400  . CKD (chronic kidney disease) stage 3, GFR 30-59 ml/min (HCC) 11/06/2019  . Edema of both legs 04/24/2018  . Bradycardia 10/31/2017  . Atrial fibrillation with RVR ([place]) 09/20/2017  . Essential hypertension, benign 09/20/2017  . Chronic gout of multiple sites 09/20/2017  . Dyslipidemia 09/20/2017  . Chronic constipation 09/20/2017  . Chronic ischemic left MCA stroke 09/20/2017  . Carotid artery disease ([place]) 09/02/2017  . Closed fracture of lumbar spine without spinal cord lesion ([place]) 08/31/2017  . Aortic atherosclerosis ([place]) 02/04/2016  . Obesity, Class II, BMI 35-39.9 01/02/2015   PAST MEDICAL HISTORY:  Active Ambulatory Problems    Diagnosis Date Noted  . Atrial fibrillation with RVR ([place]) 09/20/2017  . Essential hypertension, benign 09/20/2017  . Chronic gout of multiple sites 09/20/2017  . Dyslipidemia 09/20/2017  . Chronic constipation 09/20/2017  . Chronic ischemic left MCA stroke 09/20/2017  . Weakness 11/06/2019  . Acute lower UTI 11/06/2019  . Acute metabolic encephalopathy 86/76/1950  . CKD (chronic kidney disease) stage 3, GFR 30-59 ml/min (HCC) 11/06/2019  . Aortic atherosclerosis ([place]) 02/04/2016  . Carotid artery disease ([place]) 09/02/2017  . Bradycardia 10/31/2017  . Closed fracture of lumbar spine without spinal cord lesion  ([place]) 08/31/2017  . Edema of both legs 04/24/2018  . Obesity, Class II, BMI 35-39.9 01/02/2015   Resolved Ambulatory Problems    Diagnosis Date Noted  . No Resolved Ambulatory Problems   Past Medical History:  Diagnosis Date  . Arthritis   . Atrial fibrillation ([place])   . Carotid stenosis   . Carpal tunnel syndrome on right   . Chronic kidney disease   . Diabetes mellitus type 2, uncomplicated ([place])   . Essential tremor   . Glaucoma   . Gout   . Gout   . Hyperlipidemia   . Hypertension   . Obesity   . Stroke [facility])    SOCIAL HX:  Social History   Tobacco Use  . Smoking status: Never Smoker  . Smokeless tobacco: Never Used  Substance Use Topics  . Alcohol use: Not Currently   FAMILY HX:  Family History  Problem Relation Age of Onset  . Heart failure Mother   . Hypertension Mother   . Stroke Father   . Hypertension Brother     ALLERGIES: No Known Allergies  PERTINENT MEDICATIONS:  Outpatient Encounter Medications as of 01/10/2020  Medication Sig  . acetaminophen (TYLENOL) 325 MG tablet Take 650 mg by mouth every 4 (four) hours as needed. for pain/ increased temp. May be administered orally, per G-tube if needed or rectally if unable to swallow (separate order). Maximum dose for 24 hours is 3,000 mg from all sources of Acetaminophen/ Tylenol  . allopurinol (ZYLOPRIM) 100 MG tablet Take 100 mg by mouth 2 (two) times daily.  . Amino Acids-Protein Hydrolys (FEEDING SUPPLEMENT, PRO-STAT SUGAR FREE 64,) LIQD Take 30 mLs by mouth 2 (two) times daily.  Marland Kitchen atorvastatin (LIPITOR) 40 MG tablet Take 40 mg by mouth daily.  . bisacodyl (DULCOLAX) 5 MG EC tablet Take 5 mg by mouth daily as needed for moderate constipation.  . gabapentin (NEURONTIN) 100 MG capsule Take 100 mg by mouth 3 (three) times daily.  . Metoprolol Tartrate 75 MG TABS Take 1 tablet by mouth 2 (two) times daily. Hold for systolic BP less than 881, or pulse less than 60  . Nutritional Supplements (ENSURE ENLIVE  PO) Take 1 Bottle by mouth 2 (two) times daily.  . ondansetron (ZOFRAN) 4 MG tablet Take 4 mg by mouth every 6 (six) hours as needed for nausea or vomiting.  . polyethylene glycol (MIRALAX / GLYCOLAX) packet Take 17 g by mouth daily.  . sertraline (ZOLOFT) 50 MG tablet Take 50 mg by mouth daily.  Marland Kitchen warfarin (COUMADIN) 3 MG tablet Take 3 mg by mouth daily at 4 PM.    No facility-administered encounter medications on file as of 01/10/2020.    Objective: ROS  General: lethargic per staff EYES: denies vision changes, wears glasses ENMT: denies dysphagia Cardiovascular: denies chest pain Pulmonary: endorses  cough, endorses increased SOB, oxygen dependent Abdomen: endorses poor appetite, endorses constipation, endorses incontinence of bowel GU: denies dysuria, endorses incontinence of urine MSK:  endorses ROM limitations, fall risk Skin: denies rashes or wounds Neurological: endorses weakness, denies pain, denies insomnia Psych: Endorses flat mood, hallucinations Heme/lymph/immuno: denies bruises, abnormal bleeding  Physical Exam: Current and past weights:205 lbs Constitutional: Vital signs deferred General :very frail appearing, Obese  EYES: anicteric sclera, lids intact, no discharge  ENMT: intact hearing,oral mucous membranes moist dentition intact CV: S1S2, RRR, 2+ LE edema, 2-3+ UE edema Pulmonary: + increased work of breathing, + cough, no audible wheezes, oxygen 2-3 L, O2 sat WNL. Abdomen: intake <25%%,no ascites GU: deferred MSK: moderate sacropenia, decreased ROM in all extremities, no contractures of LE, non ambulatory Skin: warm and dry, no rashes or wounds on visible skin Neuro: Weakness, severe cognitive impairment Psych: anxious affect, A and O x 1 Hem/lymph/immuno: arm bruising bil FA   Thank you for the opportunity to participate in the care of Ms. Tetreault.  The palliative care team will continue to follow. Please call our office at [phone] if we can be of  additional assistance.  Jason Coop, NP , DNP, MPH, AGPCNP-BC, ACHPN  COVID-19 PATIENT SCREENING TOOL  Person answering questions: ____________staff______ _____   1.  Is the patient or any family member in the home showing any signs or symptoms regarding respiratory infection?               Person with Symptom- __________NA_________________  a. Fever  Yes___ No___          ___________________  b. Shortness of breath                                                    Yes___ No___          ___________________ c. Cough/congestion                                       Yes___  No___         ___________________ d. Body aches/pains                                                         Yes___ No___        ____________________ e. Gastrointestinal symptoms (diarrhea, nausea)           Yes___ No___        ____________________  2. Within the past 14 days, has anyone living in the home had any contact with someone with or under investigation for COVID-19?    Yes___ No_X_   Person __________________

## 2020-02-06 DEATH — deceased

## 2020-12-20 IMAGING — CT CT CHEST W/O CM
2 of 3 series · 15 of 36 positions shown, 18 images · non-contrast
Comparison: Chest CT dated 06/29/2006.

CLINICAL DATA: Shortness of breath, dementia.

EXAM:
CT CHEST WITHOUT CONTRAST
TECHNIQUE: Multidetector CT imaging of the chest was performed following the
standard protocol without IV contrast.

[Series 2: thorax · axial · 0.79mm/px · z∈[-860,-644]mm · 12 of 128 slices shown, 15 images]
[im 10/128  mediastinal]
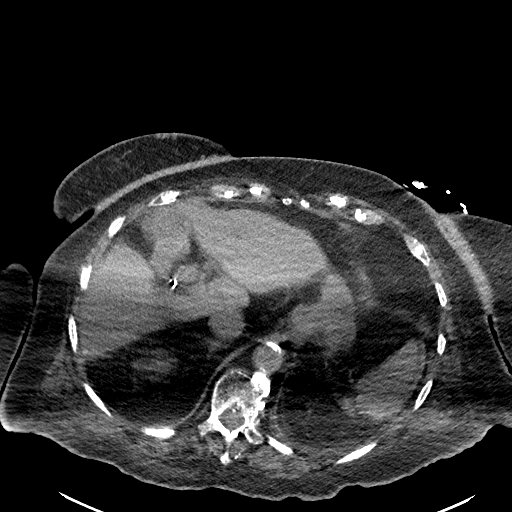
[im 10/128  lung]
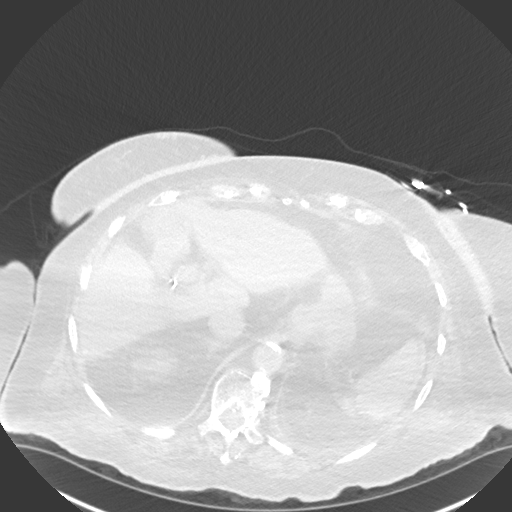
[im 19/128  lung]
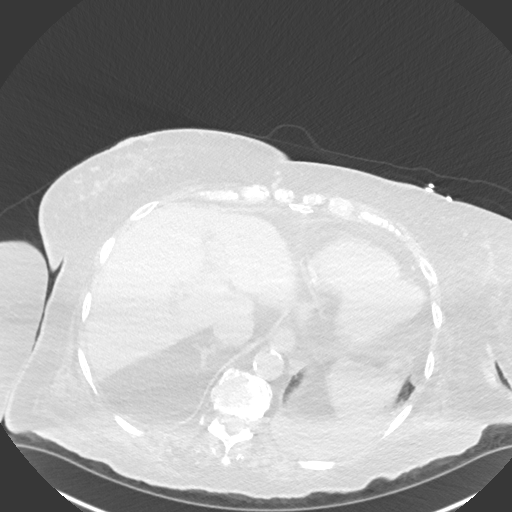
[im 29/128  lung]
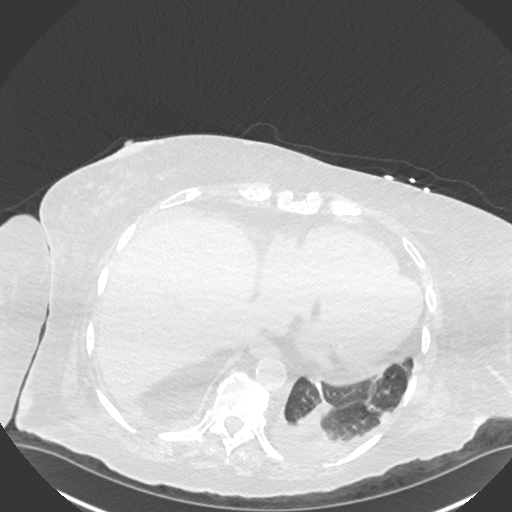
[im 38/128  lung]
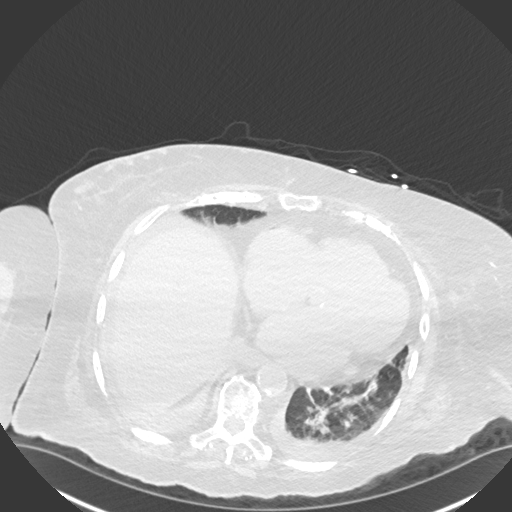
[im 48/128  mediastinal]
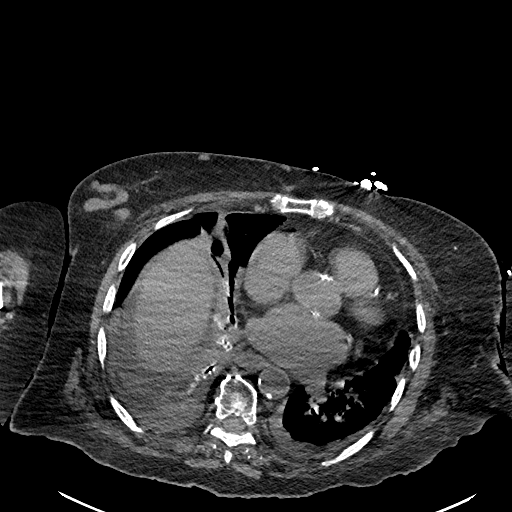
[im 48/128  lung]
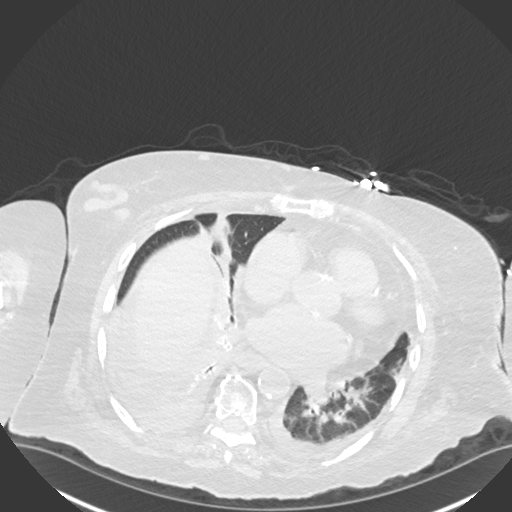
[im 57/128  lung]
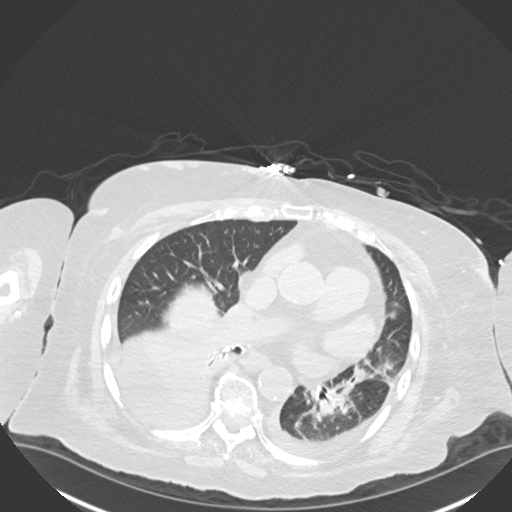
[im 71/128  lung]
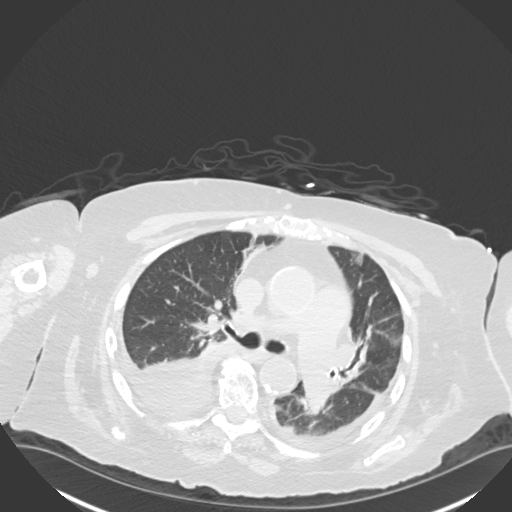
[im 80/128  lung]
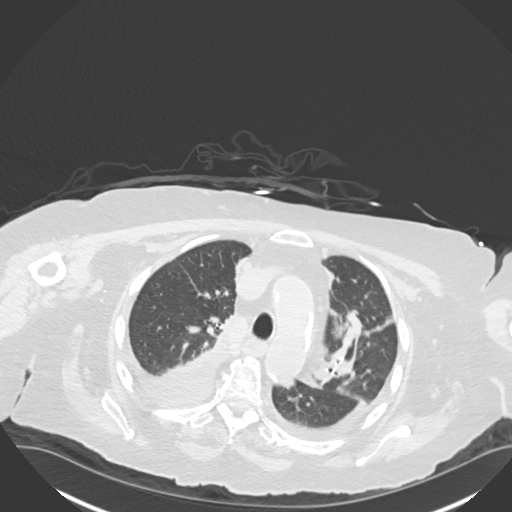
[im 90/128  mediastinal]
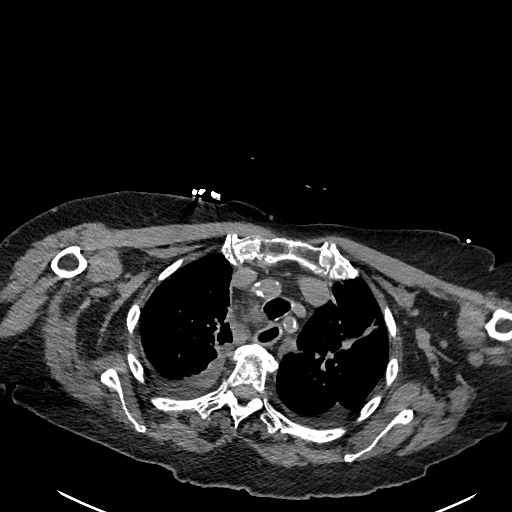
[im 90/128  lung]
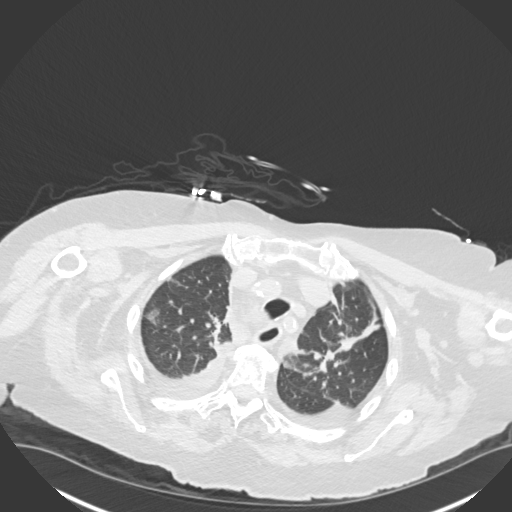
[im 99/128  lung]
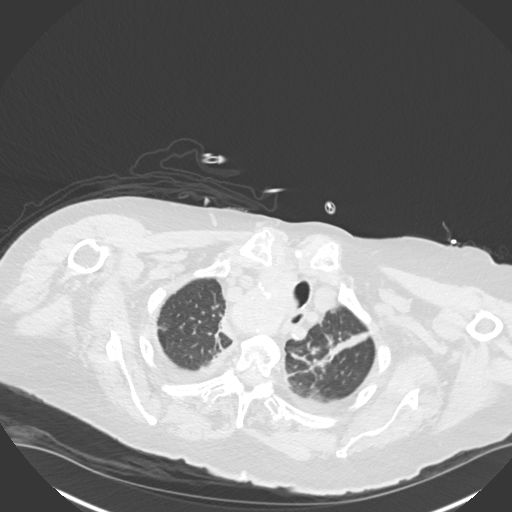
[im 109/128  lung]
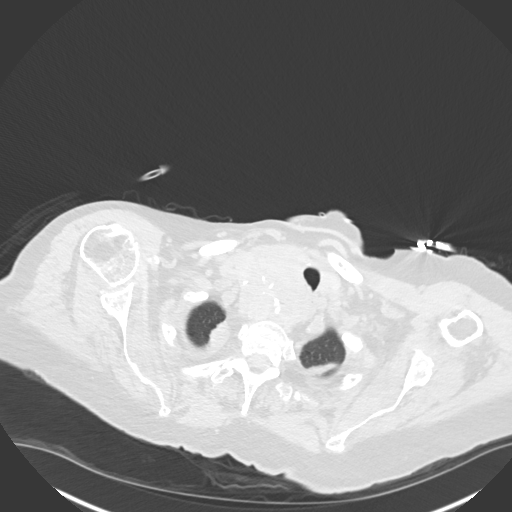
[im 118/128  lung]
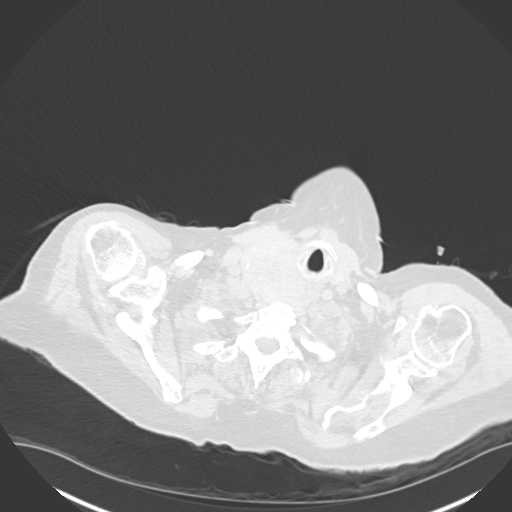

[Series 5: coronal · coronal · 0.50mm/px · 3 of 121 slices shown]
[im 25/121  lung]
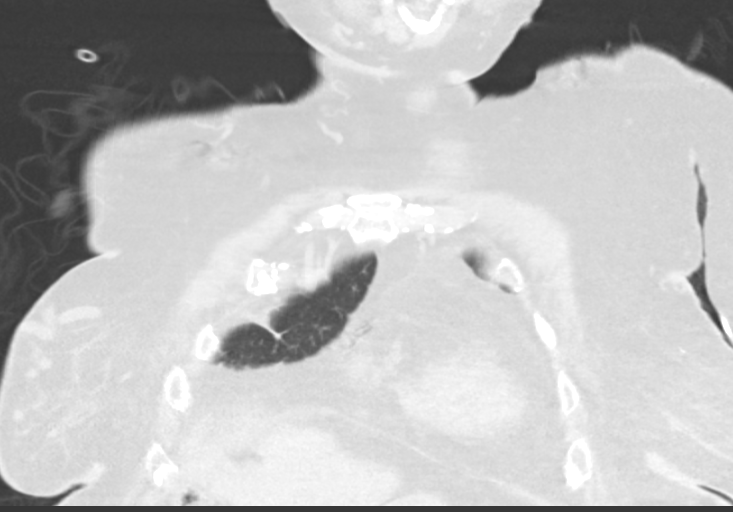
[im 49/121  lung]
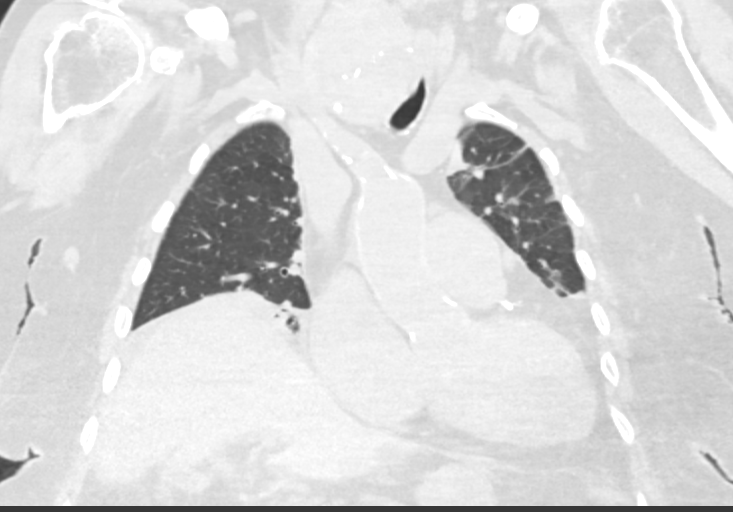
[im 73/121  lung]
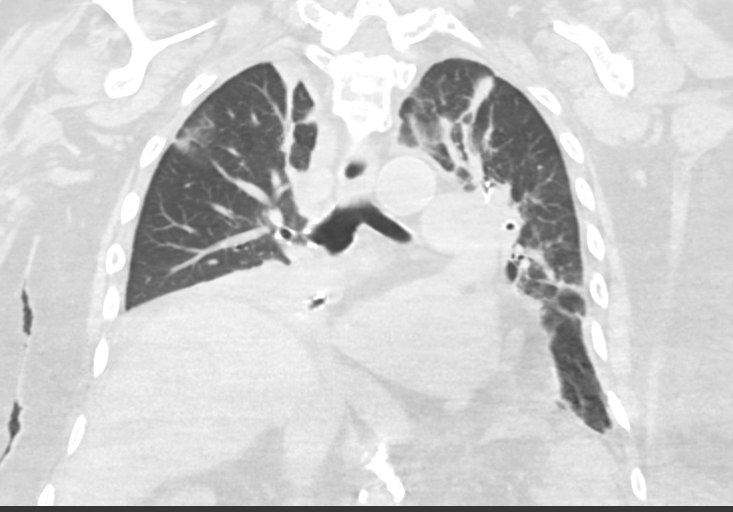

[15 of 36 positions shown; findings below may reference images not displayed]

FINDINGS: Cardiovascular: Cardiomegaly. No pericardial effusion. Aortic
atherosclerosis. No thoracic aortic aneurysm. Diffuse coronary
artery calcifications.

Mediastinum/Nodes: Redemonstration of large RIGHT thyroid goiter. No
new mass or enlarged lymph nodes within the mediastinum. Esophagus
is unremarkable. Trachea is unremarkable.

Lungs/Pleura: Patchy ground-glass opacities within each lung, LEFT
greater than RIGHT. Probable additional atelectasis within the LEFT
lower lobe. Moderate-sized RIGHT pleural effusion with associated
compressive atelectasis.

Upper Abdomen: No acute findings on limited views of the upper
abdomen.

Musculoskeletal: Degenerative spondylosis of the thoracic spine,
mild to moderate in degree. No acute appearing osseous abnormality.
IMPRESSION: 1. Patchy ground-glass opacities within each lung, LEFT greater than
RIGHT, most likely multifocal pneumonia. Differential includes
atypical pneumonias such as viral or fungal, interstitial
pneumonias, edema related to volume overload/CHF, chronic
interstitial diseases, hypersensitivity pneumonitis, and respiratory
bronchiolitis.
2. Moderate-sized RIGHT pleural effusion with associated compressive
atelectasis.
3. Cardiomegaly.
4. Diffuse coronary artery calcifications.

Aortic Atherosclerosis (C4R4R-504.4).

## 2020-12-20 IMAGING — DX DG CHEST 1V PORT
1 series · 1 of 1 positions shown · non-contrast
Comparison: 11/06/2019

CLINICAL DATA: Shortness of breath.

EXAM:
PORTABLE CHEST 1 VIEW

[chest ap]
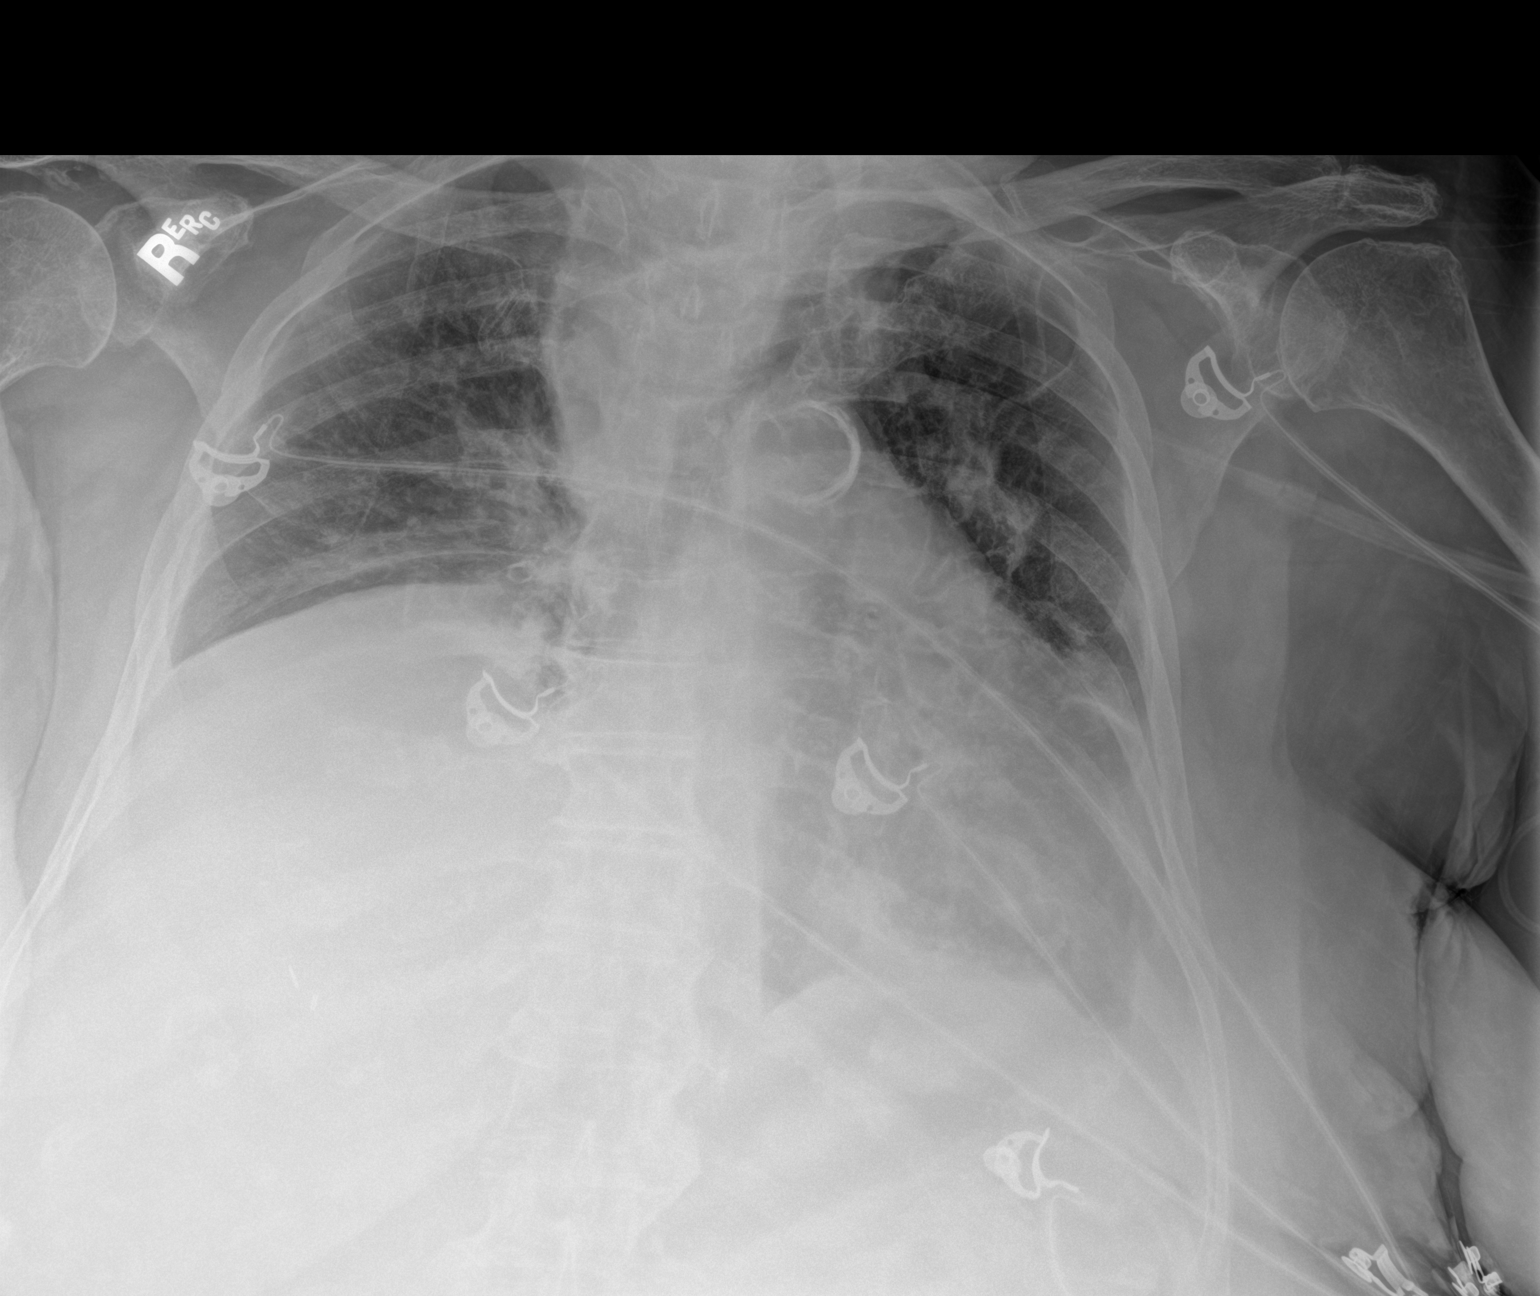

[1 of 1 positions shown; findings below may reference images not displayed]

FINDINGS: Shallow inspiration with elevation of right hemidiaphragm.
Atelectasis in the lung bases. Probable small left pleural effusion.
Cardiac enlargement. Calcification of the aorta. Right paratracheal
soft tissue with tracheal displacement towards the left consistent
with thyroid goiter. Degenerative changes in the spine and
shoulders. Surgical clips in the right upper quadrant.
IMPRESSION: Shallow inspiration with atelectasis in the lung bases. Probable
small left pleural effusion. Cardiac enlargement.
